# Patient Record
Sex: Female | Born: 1980 | Race: White | Hispanic: No | Marital: Single | Smoking: Never smoker
Health system: Southern US, Community
[De-identification: ages and names within clinical notes are randomized; demographics above are authoritative.]

## PROBLEM LIST (undated history)

## (undated) DIAGNOSIS — R569 Unspecified convulsions: Secondary | ICD-10-CM

## (undated) DIAGNOSIS — E162 Hypoglycemia, unspecified: Secondary | ICD-10-CM

---

## 2004-07-25 ENCOUNTER — Other Ambulatory Visit: Admission: RE | Admit: 2004-07-25 | Discharge: 2004-07-25 | Payer: Self-pay | Admitting: Family Medicine

## 2005-01-23 ENCOUNTER — Other Ambulatory Visit: Admission: RE | Admit: 2005-01-23 | Discharge: 2005-01-23 | Payer: Self-pay | Admitting: Family Medicine

## 2005-05-18 ENCOUNTER — Encounter: Admission: RE | Admit: 2005-05-18 | Discharge: 2005-05-18 | Payer: Self-pay | Admitting: Family Medicine

## 2005-08-04 ENCOUNTER — Other Ambulatory Visit: Admission: RE | Admit: 2005-08-04 | Discharge: 2005-08-04 | Payer: Self-pay | Admitting: Family Medicine

## 2006-08-29 ENCOUNTER — Emergency Department (HOSPITAL_COMMUNITY)
Admission: EM | Admit: 2006-08-29 | Discharge: 2006-08-29 | Payer: BLUE CROSS/BLUE SHIELD | Admitting: Emergency Medicine

## 2007-01-31 ENCOUNTER — Other Ambulatory Visit: Admission: RE | Admit: 2007-01-31 | Discharge: 2007-01-31 | Payer: Self-pay | Admitting: Family Medicine

## 2018-08-30 DIAGNOSIS — Z681 Body mass index (BMI) 19 or less, adult: Secondary | ICD-10-CM | POA: Diagnosis not present

## 2018-08-30 DIAGNOSIS — R5383 Other fatigue: Secondary | ICD-10-CM | POA: Diagnosis not present

## 2018-08-30 DIAGNOSIS — M25569 Pain in unspecified knee: Secondary | ICD-10-CM | POA: Diagnosis not present

## 2018-08-30 DIAGNOSIS — M25559 Pain in unspecified hip: Secondary | ICD-10-CM | POA: Diagnosis not present

## 2020-03-31 ENCOUNTER — Other Ambulatory Visit: Payer: Self-pay

## 2020-03-31 ENCOUNTER — Emergency Department (HOSPITAL_COMMUNITY): Payer: Self-pay

## 2020-03-31 ENCOUNTER — Emergency Department (HOSPITAL_COMMUNITY)
Admission: EM | Admit: 2020-03-31 | Discharge: 2020-03-31 | Disposition: A | Discharge status: Home / Self Care | Payer: Self-pay | Attending: Emergency Medicine | Admitting: Emergency Medicine

## 2020-03-31 DIAGNOSIS — M25562 Pain in left knee: Secondary | ICD-10-CM | POA: Insufficient documentation

## 2020-03-31 DIAGNOSIS — Z23 Encounter for immunization: Secondary | ICD-10-CM | POA: Insufficient documentation

## 2020-03-31 DIAGNOSIS — W19XXXA Unspecified fall, initial encounter: Secondary | ICD-10-CM

## 2020-03-31 DIAGNOSIS — S0121XA Laceration without foreign body of nose, initial encounter: Secondary | ICD-10-CM | POA: Insufficient documentation

## 2020-03-31 DIAGNOSIS — S022XXB Fracture of nasal bones, initial encounter for open fracture: Secondary | ICD-10-CM | POA: Insufficient documentation

## 2020-03-31 DIAGNOSIS — W010XXA Fall on same level from slipping, tripping and stumbling without subsequent striking against object, initial encounter: Secondary | ICD-10-CM | POA: Insufficient documentation

## 2020-03-31 DIAGNOSIS — Y999 Unspecified external cause status: Secondary | ICD-10-CM | POA: Insufficient documentation

## 2020-03-31 DIAGNOSIS — Y9301 Activity, walking, marching and hiking: Secondary | ICD-10-CM | POA: Insufficient documentation

## 2020-03-31 DIAGNOSIS — Y9259 Other trade areas as the place of occurrence of the external cause: Secondary | ICD-10-CM | POA: Insufficient documentation

## 2020-03-31 MED ORDER — CEPHALEXIN 500 MG PO CAPS: 500.0000 mg | ORAL_CAPSULE | Freq: Two times a day (BID) | ORAL | 0 refills | Status: AC

## 2020-03-31 MED ORDER — OXYMETAZOLINE HCL 0.05 % NA SOLN
1.0000 | Freq: Once | NASAL | Status: AC
  Administered 2020-03-31: 1 via NASAL
  Filled 2020-03-31: qty 30

## 2020-03-31 MED ORDER — TETANUS-DIPHTH-ACELL PERTUSSIS 5-2.5-18.5 LF-MCG/0.5 IM SUSP
0.5000 mL | Freq: Once | INTRAMUSCULAR | Status: AC
  Administered 2020-03-31: 0.5 mL via INTRAMUSCULAR
  Filled 2020-03-31: qty 0.5

## 2020-03-31 MED ORDER — LIDOCAINE HCL (PF) 1 % IJ SOLN
5.0000 mL | Freq: Once | INTRAMUSCULAR | Status: AC
  Administered 2020-03-31: 5 mL
  Filled 2020-03-31: qty 30

## 2020-03-31 MED ORDER — ACETAMINOPHEN 325 MG PO TABS
650.0000 mg | ORAL_TABLET | Freq: Once | ORAL | Status: AC
  Administered 2020-03-31: 650 mg via ORAL
  Filled 2020-03-31: qty 2

## 2020-03-31 MED ORDER — SODIUM CHLORIDE 0.9 % IV BOLUS: 1000.0000 mL | Freq: Once | INTRAVENOUS | Status: DC

## 2020-03-31 NOTE — ED Notes (Signed)
Patient ambulated to restroom and back to room unassisted

## 2020-03-31 NOTE — ED Notes (Signed)
Patient refused venipuncture

## 2020-03-31 NOTE — Discharge Instructions (Addendum)
Take the antibiotics as prescribed.  Hold pressure to nose if this starts bleeding  Do not blow your nose  Follow-up with the surgeon within 5 days, will need sutures removed  Return for new or worsening symptoms
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
## 2020-06-04 DIAGNOSIS — K8020 Calculus of gallbladder without cholecystitis without obstruction: Secondary | ICD-10-CM | POA: Diagnosis not present

## 2020-06-04 DIAGNOSIS — K7460 Unspecified cirrhosis of liver: Secondary | ICD-10-CM | POA: Diagnosis not present

## 2020-06-04 DIAGNOSIS — Z87898 Personal history of other specified conditions: Secondary | ICD-10-CM | POA: Diagnosis not present

## 2020-06-04 DIAGNOSIS — F419 Anxiety disorder, unspecified: Secondary | ICD-10-CM | POA: Diagnosis not present

## 2020-07-08 DIAGNOSIS — Z0000 Encounter for general adult medical examination without abnormal findings: Secondary | ICD-10-CM | POA: Diagnosis not present

## 2020-07-08 DIAGNOSIS — Z13220 Encounter for screening for lipoid disorders: Secondary | ICD-10-CM | POA: Diagnosis not present

## 2020-07-08 DIAGNOSIS — Z124 Encounter for screening for malignant neoplasm of cervix: Secondary | ICD-10-CM | POA: Diagnosis not present

## 2020-07-24 ENCOUNTER — Encounter (HOSPITAL_COMMUNITY): Payer: Self-pay

## 2020-07-24 ENCOUNTER — Other Ambulatory Visit: Payer: Self-pay

## 2020-07-24 ENCOUNTER — Emergency Department (HOSPITAL_COMMUNITY)
Admission: EM | Admit: 2020-07-24 | Discharge: 2020-07-24 | Disposition: A | Discharge status: Home / Self Care | Payer: BC Managed Care – PPO | Attending: Emergency Medicine | Admitting: Emergency Medicine

## 2020-07-24 ENCOUNTER — Emergency Department (HOSPITAL_COMMUNITY): Payer: BC Managed Care – PPO

## 2020-07-24 DIAGNOSIS — S0993XA Unspecified injury of face, initial encounter: Secondary | ICD-10-CM | POA: Diagnosis not present

## 2020-07-24 DIAGNOSIS — R7401 Elevation of levels of liver transaminase levels: Secondary | ICD-10-CM | POA: Diagnosis not present

## 2020-07-24 DIAGNOSIS — F10129 Alcohol abuse with intoxication, unspecified: Secondary | ICD-10-CM | POA: Diagnosis not present

## 2020-07-24 DIAGNOSIS — F10920 Alcohol use, unspecified with intoxication, uncomplicated: Secondary | ICD-10-CM

## 2020-07-24 DIAGNOSIS — S0181XA Laceration without foreign body of other part of head, initial encounter: Secondary | ICD-10-CM

## 2020-07-24 DIAGNOSIS — X58XXXA Exposure to other specified factors, initial encounter: Secondary | ICD-10-CM | POA: Insufficient documentation

## 2020-07-24 DIAGNOSIS — S01511A Laceration without foreign body of lip, initial encounter: Secondary | ICD-10-CM | POA: Insufficient documentation

## 2020-07-24 DIAGNOSIS — R531 Weakness: Secondary | ICD-10-CM | POA: Diagnosis not present

## 2020-07-24 DIAGNOSIS — R410 Disorientation, unspecified: Secondary | ICD-10-CM | POA: Diagnosis not present

## 2020-07-24 DIAGNOSIS — R4182 Altered mental status, unspecified: Secondary | ICD-10-CM | POA: Diagnosis not present

## 2020-07-24 DIAGNOSIS — W19XXXA Unspecified fall, initial encounter: Secondary | ICD-10-CM | POA: Diagnosis not present

## 2020-07-24 DIAGNOSIS — F10929 Alcohol use, unspecified with intoxication, unspecified: Secondary | ICD-10-CM | POA: Diagnosis not present

## 2020-07-24 HISTORY — DX: Unspecified convulsions: R56.9

## 2020-07-24 HISTORY — DX: Hypoglycemia, unspecified: E16.2

## 2020-07-24 LAB — COMPREHENSIVE METABOLIC PANEL
ALT: 56 U/L — ABNORMAL HIGH (ref 0–44)
AST: 262 U/L — ABNORMAL HIGH (ref 15–41)
Albumin: 3.9 g/dL (ref 3.5–5.0)
Alkaline Phosphatase: 225 U/L — ABNORMAL HIGH (ref 38–126)
Anion gap: 15 (ref 5–15)
BUN: <5 mg/dL — ABNORMAL LOW (ref 6–20)
CO2: 25 mmol/L (ref 22–32)
Calcium: 8.4 mg/dL — ABNORMAL LOW (ref 8.9–10.3)
Chloride: 102 mmol/L (ref 98–111)
Creatinine, Ser: 0.47 mg/dL (ref 0.44–1.00)
GFR, Estimated: >60 mL/min (ref >60)
Glucose, Bld: 125 mg/dL — ABNORMAL HIGH (ref 70–99)
Potassium: 3.5 mmol/L (ref 3.5–5.1)
Sodium: 142 mmol/L (ref 135–145)
Total Bilirubin: 6.4 mg/dL — ABNORMAL HIGH (ref 0.3–1.2)
Total Protein: 8.6 g/dL — ABNORMAL HIGH (ref 6.5–8.1)

## 2020-07-24 LAB — CBC WITH DIFFERENTIAL/PLATELET
Abs Immature Granulocytes: 0.01 10*3/uL (ref 0.00–0.07)
Basophils Absolute: 0.1 10*3/uL (ref 0.0–0.1)
Basophils Relative: 2 %
Eosinophils Absolute: 0.1 10*3/uL (ref 0.0–0.5)
Eosinophils Relative: 1 %
HCT: 41.3 % (ref 36.0–46.0)
Hemoglobin: 14.1 g/dL (ref 12.0–15.0)
Immature Granulocytes: 0 %
Lymphocytes Relative: 23 %
Lymphs Abs: 1.2 10*3/uL (ref 0.7–4.0)
MCH: 33.5 pg (ref 26.0–34.0)
MCHC: 34.1 g/dL (ref 30.0–36.0)
MCV: 98.1 fL (ref 80.0–100.0)
Monocytes Absolute: 0.5 10*3/uL (ref 0.1–1.0)
Monocytes Relative: 11 %
Neutro Abs: 3.2 10*3/uL (ref 1.7–7.7)
Neutrophils Relative %: 63 %
Platelets: 75 10*3/uL — ABNORMAL LOW (ref 150–400)
RBC: 4.21 MIL/uL (ref 3.87–5.11)
RDW: 15.1 % (ref 11.5–15.5)
WBC: 5 10*3/uL (ref 4.0–10.5)
nRBC: 0 % (ref 0.0–0.2)

## 2020-07-24 LAB — HEPATITIS PANEL, ACUTE
HCV Ab: NONREACTIVE
Hep A IgM: NONREACTIVE
Hep B C IgM: NONREACTIVE
Hepatitis B Surface Ag: NONREACTIVE

## 2020-07-24 LAB — RAPID URINE DRUG SCREEN, HOSP PERFORMED
Amphetamines: NOT DETECTED
Barbiturates: NOT DETECTED
Benzodiazepines: POSITIVE — AB
Cocaine: NOT DETECTED
Opiates: NOT DETECTED
Tetrahydrocannabinol: NOT DETECTED

## 2020-07-24 LAB — ETHANOL: Alcohol, Ethyl (B): 468 mg/dL (ref <10)

## 2020-07-24 LAB — POC URINE PREG, ED: Preg Test, Ur: NEGATIVE

## 2020-07-24 LAB — CBG MONITORING, ED: Glucose-Capillary: 115 mg/dL — ABNORMAL HIGH (ref 70–99)

## 2020-07-24 NOTE — Discharge Instructions (Signed)
You have been evaluated for your symptoms.  Your alcohol level is very high today.  Please avoid excessive drinking as it will negatively affect your health. You suffered a cut to your face.  Dermabond was placed to the wound.  Allow for it to dry and avoid prolong water exposure for the next several days. It will eventually fall off in the next 3-5 days.  Your liver function is abnormal, this is likely due to alcohol abuse.  Follow up with your doctor for further care.  Use resource below to seek help with alcohol dependency.  Return if you have any concerns.

## 2020-07-24 NOTE — ED Provider Notes (Signed)
[facility] DEPT Provider Note   CSN: [id number] Arrival date & time: 07/24/20  1107     History Chief Complaint  Patient presents with  . Altered Mental Status    Jill Farmer is a 39 y.o. female.  The history is provided by the patient, the EMS personnel and medical records. No language interpreter was used.  Altered Mental Status    39 year old female significant history of isolated seizure in 2017 brought here via EMS for evaluation of altered mental status.  Per EMS, patient's friend was concerned as patient was laying on the couch and disoriented after drinking heavy amount of alcohol last night.  Friend also mentioned that patient fell.  Patient however denies any recent fall or syncope.  She admits that she did drink heavy amount of alcohol after finding out that her ex-boyfriend recently passed away.  She appears feeling drowsy but denies any severe headache or facial pain.  She does admits having chronic pain but denies increase opiate use.  She did mention having a fall several months ago and injuring her face with leading up to a deviated septum which is not new.  She denies any facial pain or nose pain.  She denies any active chest pain shortness of breath abdominal pain nausea or vomiting.  She has been fully vaccinated with COVID-19 vaccine.  She denies any drug use.  Past Medical History:  Diagnosis Date  . Hypoglycemia   . Seizures ([place])    single episode 2017    There are no problems to display for this patient.   History reviewed. No pertinent surgical history.   OB History   No obstetric history on file.     History reviewed. No pertinent family history.  Social History   Tobacco Use  . Smoking status: Never Smoker  . Smokeless tobacco: Never Used  Substance Use Topics  . Alcohol use: Not on file  . Drug use: Not on file    Home Medications Prior to Admission medications   Not on File    Allergies    Nsaids  and Sulfa antibiotics  Review of Systems   Review of Systems  All other systems reviewed and are negative.   Physical Exam Updated Vital Signs BP 115/80 (BP Location: Right Arm)   Pulse 82   Temp 98 F (36.7 C) (Oral)   Resp 20   Ht _0  (1.702 m)   Wt 65 kg   SpO2 96%   BMI 22.44 kg/m   Physical Exam Vitals and nursing note reviewed.  Constitutional:      General: She is not in acute distress.    Appearance: She is well-developed.     Comments: Alcohol odor noted.  HENT:     Head:     Comments:  No raccoon's eyes or battle signs.  Dried blood noted Around the nares as well as around her lips.  There is a small superficial laceration noted right below the nose.  No dental tenderness.  No midface tenderness and no malocclusion.  No scalp tenderness.    Nose:     Comments: Left deviated septum with dried blood around the nares no septal hematoma appreciated.  Eyes:     Extraocular Movements: Extraocular movements intact.     Conjunctiva/sclera: Conjunctivae normal.     Pupils: Pupils are equal, round, and reactive to light.     Comments: Pupils dilated bilaterally reactive with extra movement intact.  Cardiovascular:  Rate and Rhythm: Normal rate and regular rhythm.     Pulses: Normal pulses.     Heart sounds: Normal heart sounds.  Pulmonary:     Effort: Pulmonary effort is normal.     Breath sounds: Normal breath sounds. No wheezing, rhonchi or rales.  Abdominal:     Palpations: Abdomen is soft.     Tenderness: There is no abdominal tenderness.  Musculoskeletal:     Cervical back: Neck supple.  Skin:    Findings: No rash.  Neurological:     Mental Status: She is alert and oriented to person, place, and time.     GCS: GCS eye subscore is 4. GCS verbal subscore is 5. GCS motor subscore is 6.     Cranial Nerves: Cranial nerves are intact.     Sensory: Sensation is intact.     Motor: Tremor present. No weakness.     Comments: Speech delay however patient is  alert oriented to situation, place, time and current event.  Psychiatric:        Mood and Affect: Affect is tearful.        Behavior: Behavior is cooperative.        Thought Content: Thought content does not include homicidal or suicidal ideation.     ED Results / Procedures / Treatments   Labs (all labs ordered are listed, but only abnormal results are displayed) Labs Reviewed  COMPREHENSIVE METABOLIC PANEL - Abnormal; Notable for the following components:      Result Value   Glucose, Bld 125 (*)    BUN <5 (*)    Calcium 8.4 (*)    Total Protein 8.6 (*)    AST 262 (*)    ALT 56 (*)    Alkaline Phosphatase 225 (*)    Total Bilirubin 6.4 (*)    All other components within normal limits  CBC WITH DIFFERENTIAL/PLATELET - Abnormal; Notable for the following components:   Platelets 75 (*)    All other components within normal limits  ETHANOL - Abnormal; Notable for the following components:   Alcohol, Ethyl (B) 468 (*)    All other components within normal limits  CBG MONITORING, ED - Abnormal; Notable for the following components:   Glucose-Capillary 115 (*)    All other components within normal limits  RAPID URINE DRUG SCREEN, HOSP PERFORMED  HEPATITIS PANEL, ACUTE  POC URINE PREG, ED    EKG EKG Interpretation  Date/Time:  Wednesday July 24 2020 11:39:51 EDT Ventricular Rate:  98 PR Interval:    QRS Duration: 84 QT Interval:  356 QTC Calculation: 455 R Axis:   3 Text Interpretation: Sinus rhythm Low voltage, extremity and precordial leads Probable anteroseptal infarct, old No old tracing to compare Confirmed by Sherwood Gambler [phone]) on 07/24/2020 11:56:21 AM   Radiology CT Maxillofacial Wo Contrast  Result Date: 07/24/2020 CLINICAL DATA:  Facial trauma last night. EXAM: CT MAXILLOFACIAL WITHOUT CONTRAST TECHNIQUE: Multidetector CT imaging of the maxillofacial structures was performed. Multiplanar CT image reconstructions were also generated. COMPARISON:   03/31/2020 FINDINGS: Osseous: No acute facial fracture. Nasal fractures which were acute in July of this year have healed. No evidence of additional bone injury. Nasal septum bows prominently towards the left. Orbits: Normal Sinuses: Clear.  No inflammatory change or layering blood. Soft tissues: Otherwise negative Limited intracranial: No acute finding. IMPRESSION: No acute traumatic finding. Nasal fractures which were acute in July of this year have healed. Electronically Signed   By: Jan Fireman.D.  On: 07/24/2020 12:23    Procedures .Marland KitchenLaceration Repair  Date/Time: 07/24/2020 2:41 PM Performed by: Domenic Moras, PA-C Authorized by: Domenic Moras, PA-C   Consent:    Consent obtained:  Verbal   Consent given by:  Patient   Risks discussed:  Infection, need for additional repair, pain, poor cosmetic result and poor wound healing   Alternatives discussed:  No treatment and delayed treatment Universal protocol:    Procedure explained and questions answered to patient or proxy's satisfaction: yes     Relevant documents present and verified: yes     Test results available and properly labeled: yes     Imaging studies available: yes     Required blood products, implants, devices, and special equipment available: yes     Site/side marked: yes     Immediately prior to procedure, a time out was called: yes     Patient identity confirmed:  Verbally with patient Anesthesia (see MAR for exact dosages):    Anesthesia method:  None Laceration details:    Location:  Lip   Lip location:  Upper exterior lip   Length (cm):  0.5   Depth (mm):  1 Repair type:    Repair type:  Simple Pre-procedure details:    Preparation:  Imaging obtained to evaluate for foreign bodies Exploration:    Wound extent: no foreign bodies/material noted and no underlying fracture noted     Contaminated: no   Skin repair:    Repair method:  Tissue adhesive Approximation:    Approximation:  Close Post-procedure  details:    Dressing:  Open (no dressing)   (including critical care time)  Medications Ordered in ED Medications - No data to display  ED Course  I have reviewed the triage vital signs and the nursing notes.  Pertinent labs & imaging results that were available during my care of the patient were reviewed by me and considered in my medical decision making (see chart for details).    MDM Rules/Calculators/A&P                          BP 111/70   Pulse (!) 103   Temp 98 F (36.7 C) (Oral)   Resp 13   Ht _0  (1.702 m)   Wt 65 kg   SpO2 97%   BMI 22.44 kg/m   Final Clinical Impression(s) / ED Diagnoses Final diagnoses:  None    Rx / DC Orders ED Discharge Orders    None     11:51 AM Patient sent here for evaluation of altered from altered mental status likely secondary to alcohol intoxication as she did admit to consuming moderate amount of alcohol last night.  Alcohol consumption secondary to recent loss of loved one.  No SI or HI.  She does have dried blood noted in her nares and a small laceration just below her nose.  She denies falling but in the setting of obvious injury, will obtain head and maxillofacial CT scan for further assessment.  Work-up initiated.  1:14 PM Labs remarkable for an alcohol of 468, likely contribute to patient's altered mental status secondary to alcohol intoxication.  Evidence of transaminitis with AST 262, ALT 56, alk phos 225 and total bili of 6.4.  This is likely secondary to alcohol abuse as well.  No abdominal pain to suggest acute hepatitis.  Hepatitis panel ordered.  We will continue with IV hydration.  Maxillofacial CT without acute changes.  Previous nasal  fracture has healed.  2:41 PM Dermabond applied to superficial laceration about the lip.  Hepatitis panel is currently pending however patient reports she does have history of transaminitis which is not new.  She denies recent Tylenol use.  She requested something to eat and drink.   Will provide food and drink.  At this time patient is not clinically sober and will need to be monitored further.  3:26 PM Hepatitis panel pending.  Pt sign out to oncoming provider who will reassess and d/c pt once she is clinically sober.   Domenic Moras, PA-C 07/24/20 1527    Sherwood Gambler, MD 07/24/20 [phone]

## 2020-07-24 NOTE — ED Notes (Signed)
Patient states her mother arrived to pick patient up, ambulated out of the department with no difficulty

## 2020-07-24 NOTE — ED Notes (Signed)
Patient ambulated to the restroom with no difficulty, got dressed and states she is going to call for transportation home. EDP made aware.

## 2020-07-24 NOTE — ED Notes (Signed)
dermabond placed in room for MD

## 2020-07-24 NOTE — ED Triage Notes (Signed)
Friend called ems due to patient laying on couch on her side disoriented. Patient a&O to self and year, pt slow to respond. PERRLA, pupils 17mm. Pt admits to ETOH use last night, denies drugs. Friend states patient fell but patient denies fall. Dried blood to nostrils, pt states deviated septum recently.

## 2020-07-24 NOTE — ED Notes (Signed)
Patient transported to CT 

## 2020-07-24 NOTE — ED Notes (Signed)
Date and time results received: 07/24/20 1311 (use smartphrase ".now" to insert current time)  Test: ETOH Critical Value: 468 Name of Provider Notified: Regenia Skeeter  Orders Received? Or Actions Taken?:

## 2020-07-24 NOTE — ED Provider Notes (Signed)
Patient taken in signout from [place] at shift change at 3 PM.  Patient came in with alcohol intoxication.  She has been sleeping the whole time however got up walked without any assistance or ataxia to use the restroom, came back to her bed and called her ride because she states she is ready to go.  She is now clinically sober if not an emergency and appears appropriate for discharge.   Margarita Mail, PA-C 07/24/20 2024    Isla Pence, MD 07/24/20 [phone]

## 2020-07-24 NOTE — ED Notes (Signed)
Date and time results received: 07/24/20 1:11 PM  (use smartphrase ".now" to insert current time)  Test: ETOH Critical Value: 468 Name of Provider Notified: Notified Primary RN, Caryl Pina  Orders Received? Or Actions Taken?: Actions Taken: Notified primary RN

## 2020-08-04 ENCOUNTER — Emergency Department (HOSPITAL_COMMUNITY)
Admission: EM | Admit: 2020-08-04 | Discharge: 2020-08-04 | Disposition: A | Discharge status: Home / Self Care | Payer: BC Managed Care – PPO | Attending: Emergency Medicine | Admitting: Emergency Medicine

## 2020-08-04 ENCOUNTER — Other Ambulatory Visit: Payer: Self-pay

## 2020-08-04 DIAGNOSIS — F10929 Alcohol use, unspecified with intoxication, unspecified: Secondary | ICD-10-CM | POA: Diagnosis not present

## 2020-08-04 DIAGNOSIS — F10129 Alcohol abuse with intoxication, unspecified: Secondary | ICD-10-CM | POA: Insufficient documentation

## 2020-08-04 DIAGNOSIS — F1010 Alcohol abuse, uncomplicated: Secondary | ICD-10-CM

## 2020-08-04 DIAGNOSIS — R0902 Hypoxemia: Secondary | ICD-10-CM | POA: Diagnosis not present
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
## 2020-08-04 NOTE — ED Triage Notes (Signed)
PER [facility] [place] PD gave pt option to go to jail for 24h hold or go to hospital. Pt was at [facility] and was kicked out. PT has severe ETOH last drink this AM of 2 vodka tonics HX gall stones, allergy to insaids  and has chronic pain.

## 2020-08-06 DIAGNOSIS — E46 Unspecified protein-calorie malnutrition: Secondary | ICD-10-CM | POA: Diagnosis not present

## 2020-08-06 DIAGNOSIS — F1020 Alcohol dependence, uncomplicated: Secondary | ICD-10-CM | POA: Diagnosis not present

## 2020-08-06 DIAGNOSIS — K7460 Unspecified cirrhosis of liver: Secondary | ICD-10-CM | POA: Diagnosis not present

## 2020-08-06 DIAGNOSIS — E876 Hypokalemia: Secondary | ICD-10-CM | POA: Diagnosis not present

## 2020-08-21 ENCOUNTER — Encounter (HOSPITAL_COMMUNITY): Payer: Self-pay | Admitting: Emergency Medicine

## 2020-08-21 ENCOUNTER — Emergency Department (HOSPITAL_COMMUNITY): Payer: BC Managed Care – PPO

## 2020-08-21 ENCOUNTER — Inpatient Hospital Stay (HOSPITAL_COMMUNITY)
Admission: EM | Admit: 2020-08-21 | Discharge: 2020-08-23 | DRG: 433 | Disposition: A | Discharge status: Home / Self Care | Payer: BC Managed Care – PPO | Attending: Family Medicine | Admitting: Family Medicine

## 2020-08-21 DIAGNOSIS — G8929 Other chronic pain: Secondary | ICD-10-CM | POA: Diagnosis present

## 2020-08-21 DIAGNOSIS — K8020 Calculus of gallbladder without cholecystitis without obstruction: Secondary | ICD-10-CM | POA: Diagnosis present

## 2020-08-21 DIAGNOSIS — K7031 Alcoholic cirrhosis of liver with ascites: Principal | ICD-10-CM

## 2020-08-21 DIAGNOSIS — D689 Coagulation defect, unspecified: Secondary | ICD-10-CM | POA: Diagnosis not present

## 2020-08-21 DIAGNOSIS — K7011 Alcoholic hepatitis with ascites: Secondary | ICD-10-CM | POA: Diagnosis not present

## 2020-08-21 DIAGNOSIS — R109 Unspecified abdominal pain: Secondary | ICD-10-CM | POA: Diagnosis not present

## 2020-08-21 DIAGNOSIS — M7050 Other bursitis of knee, unspecified knee: Secondary | ICD-10-CM | POA: Diagnosis present

## 2020-08-21 DIAGNOSIS — Z886 Allergy status to analgesic agent status: Secondary | ICD-10-CM

## 2020-08-21 DIAGNOSIS — E162 Hypoglycemia, unspecified: Secondary | ICD-10-CM | POA: Diagnosis present

## 2020-08-21 DIAGNOSIS — F1020 Alcohol dependence, uncomplicated: Secondary | ICD-10-CM | POA: Diagnosis not present

## 2020-08-21 DIAGNOSIS — K7040 Alcoholic hepatic failure without coma: Secondary | ICD-10-CM | POA: Diagnosis present

## 2020-08-21 DIAGNOSIS — Z882 Allergy status to sulfonamides status: Secondary | ICD-10-CM | POA: Diagnosis not present

## 2020-08-21 DIAGNOSIS — M549 Dorsalgia, unspecified: Secondary | ICD-10-CM | POA: Diagnosis present

## 2020-08-21 DIAGNOSIS — R000 Tachycardia, unspecified: Secondary | ICD-10-CM | POA: Diagnosis not present

## 2020-08-21 DIAGNOSIS — Z20822 Contact with and (suspected) exposure to covid-19: Secondary | ICD-10-CM | POA: Diagnosis not present

## 2020-08-21 DIAGNOSIS — R17 Unspecified jaundice: Secondary | ICD-10-CM | POA: Diagnosis not present

## 2020-08-21 DIAGNOSIS — R1084 Generalized abdominal pain: Secondary | ICD-10-CM | POA: Insufficient documentation

## 2020-08-21 DIAGNOSIS — R0602 Shortness of breath: Secondary | ICD-10-CM | POA: Diagnosis not present

## 2020-08-21 DIAGNOSIS — R569 Unspecified convulsions: Secondary | ICD-10-CM | POA: Diagnosis not present

## 2020-08-21 DIAGNOSIS — K7460 Unspecified cirrhosis of liver: Secondary | ICD-10-CM | POA: Diagnosis not present

## 2020-08-21 DIAGNOSIS — K7010 Alcoholic hepatitis without ascites: Secondary | ICD-10-CM | POA: Diagnosis present

## 2020-08-21 DIAGNOSIS — R188 Other ascites: Secondary | ICD-10-CM | POA: Diagnosis not present

## 2020-08-21 LAB — LIPASE, BLOOD: Lipase: 47 U/L (ref 11–51)

## 2020-08-21 LAB — URINALYSIS, ROUTINE W REFLEX MICROSCOPIC
Glucose, UA: NEGATIVE mg/dL
Hgb urine dipstick: NEGATIVE
Ketones, ur: NEGATIVE mg/dL
Leukocytes,Ua: NEGATIVE
Nitrite: POSITIVE — AB
Protein, ur: NEGATIVE mg/dL
Specific Gravity, Urine: >1.046 — ABNORMAL HIGH (ref 1.005–1.030)
pH: 6 (ref 5.0–8.0)

## 2020-08-21 LAB — COMPREHENSIVE METABOLIC PANEL
ALT: 38 U/L (ref 0–44)
AST: 163 U/L — ABNORMAL HIGH (ref 15–41)
Albumin: 2.4 g/dL — ABNORMAL LOW (ref 3.5–5.0)
Alkaline Phosphatase: 140 U/L — ABNORMAL HIGH (ref 38–126)
Anion gap: 13 (ref 5–15)
BUN: <5 mg/dL — ABNORMAL LOW (ref 6–20)
CO2: 25 mmol/L (ref 22–32)
Calcium: 8.9 mg/dL (ref 8.9–10.3)
Chloride: 96 mmol/L — ABNORMAL LOW (ref 98–111)
Creatinine, Ser: 0.64 mg/dL (ref 0.44–1.00)
GFR, Estimated: >60 mL/min (ref >60)
Glucose, Bld: 113 mg/dL — ABNORMAL HIGH (ref 70–99)
Potassium: 3.2 mmol/L — ABNORMAL LOW (ref 3.5–5.1)
Sodium: 134 mmol/L — ABNORMAL LOW (ref 135–145)
Total Bilirubin: 19.3 mg/dL (ref 0.3–1.2)
Total Protein: 7.8 g/dL (ref 6.5–8.1)

## 2020-08-21 LAB — BODY FLUID CELL COUNT WITH DIFFERENTIAL
Eos, Fluid: 0 %
Lymphs, Fluid: 65 %
Monocyte-Macrophage-Serous Fluid: 32 % — ABNORMAL LOW (ref 50–90)
Neutrophil Count, Fluid: 3 % (ref 0–25)
Total Nucleated Cell Count, Fluid: 109 cu mm (ref 0–1000)

## 2020-08-21 LAB — CBC WITH DIFFERENTIAL/PLATELET
Abs Immature Granulocytes: 0.08 10*3/uL — ABNORMAL HIGH (ref 0.00–0.07)
Basophils Absolute: 0.1 10*3/uL (ref 0.0–0.1)
Basophils Relative: 2 %
Eosinophils Absolute: 0.1 10*3/uL (ref 0.0–0.5)
Eosinophils Relative: 1 %
HCT: 40.3 % (ref 36.0–46.0)
Hemoglobin: 13.7 g/dL (ref 12.0–15.0)
Immature Granulocytes: 1 %
Lymphocytes Relative: 12 %
Lymphs Abs: 1 10*3/uL (ref 0.7–4.0)
MCH: 34.2 pg — ABNORMAL HIGH (ref 26.0–34.0)
MCHC: 34 g/dL (ref 30.0–36.0)
MCV: 100.5 fL — ABNORMAL HIGH (ref 80.0–100.0)
Monocytes Absolute: 1.1 10*3/uL — ABNORMAL HIGH (ref 0.1–1.0)
Monocytes Relative: 13 %
Neutro Abs: 6.3 10*3/uL (ref 1.7–7.7)
Neutrophils Relative %: 71 %
Platelets: 165 10*3/uL (ref 150–400)
RBC: 4.01 MIL/uL (ref 3.87–5.11)
RDW: 16.3 % — ABNORMAL HIGH (ref 11.5–15.5)
WBC: 8.8 10*3/uL (ref 4.0–10.5)
nRBC: 0 % (ref 0.0–0.2)

## 2020-08-21 LAB — LACTATE DEHYDROGENASE, PLEURAL OR PERITONEAL FLUID: LD, Fluid: 16 U/L (ref 3–23)

## 2020-08-21 LAB — RESP PANEL BY RT-PCR (FLU A&B, COVID) ARPGX2
Influenza A by PCR: NEGATIVE
Influenza B by PCR: NEGATIVE
SARS Coronavirus 2 by RT PCR: NEGATIVE

## 2020-08-21 LAB — LACTATE DEHYDROGENASE: LDH: 178 U/L (ref 98–192)

## 2020-08-21 LAB — APTT: aPTT: 40 seconds — ABNORMAL HIGH (ref 24–36)

## 2020-08-21 LAB — I-STAT BETA HCG BLOOD, ED (MC, WL, AP ONLY): I-stat hCG, quantitative: <5 m[IU]/mL (ref <5)

## 2020-08-21 LAB — ALBUMIN, PLEURAL OR PERITONEAL FLUID: Albumin, Fluid: <1 g/dL

## 2020-08-21 LAB — GLUCOSE, PLEURAL OR PERITONEAL FLUID: Glucose, Fluid: 108 mg/dL

## 2020-08-21 LAB — AMMONIA: Ammonia: 63 umol/L — ABNORMAL HIGH (ref 9–35)

## 2020-08-21 LAB — PROTIME-INR
INR: 1.7 — ABNORMAL HIGH (ref 0.8–1.2)
Prothrombin Time: 19.4 seconds — ABNORMAL HIGH (ref 11.4–15.2)

## 2020-08-21 LAB — PROTEIN, PLEURAL OR PERITONEAL FLUID: Total protein, fluid: <3 g/dL

## 2020-08-21 MED ORDER — FOLIC ACID 1 MG PO TABS
1.0000 mg | ORAL_TABLET | Freq: Every day | ORAL | Status: DC
  Administered 2020-08-22 – 2020-08-23 (×3): 1 mg via ORAL
  Filled 2020-08-21 (×3): qty 1

## 2020-08-21 MED ORDER — MORPHINE SULFATE (PF) 4 MG/ML IV SOLN
4.0000 mg | INTRAVENOUS | Status: AC | PRN
  Administered 2020-08-22 (×2): 4 mg via INTRAVENOUS
  Filled 2020-08-21 (×3): qty 1

## 2020-08-21 MED ORDER — MORPHINE SULFATE (PF) 4 MG/ML IV SOLN
4.0000 mg | Freq: Once | INTRAVENOUS | Status: AC
  Administered 2020-08-21: 4 mg via INTRAVENOUS
  Filled 2020-08-21: qty 1

## 2020-08-21 MED ORDER — LORAZEPAM 1 MG PO TABS: 1.0000 mg | ORAL_TABLET | ORAL | Status: DC | PRN

## 2020-08-21 MED ORDER — ALBUMIN HUMAN 25 % IV SOLN
12.5000 g | Freq: Four times a day (QID) | INTRAVENOUS | Status: DC
  Administered 2020-08-22 – 2020-08-23 (×6): 12.5 g via INTRAVENOUS
  Filled 2020-08-21 (×10): qty 50

## 2020-08-21 MED ORDER — ADULT MULTIVITAMIN W/MINERALS CH
1.0000 | ORAL_TABLET | Freq: Every day | ORAL | Status: DC
  Administered 2020-08-22 – 2020-08-23 (×3): 1 via ORAL
  Filled 2020-08-21 (×4): qty 1

## 2020-08-21 MED ORDER — ONDANSETRON HCL 4 MG/2ML IJ SOLN: 4.0000 mg | Freq: Four times a day (QID) | INTRAMUSCULAR | Status: DC | PRN

## 2020-08-21 MED ORDER — LIDOCAINE HCL (PF) 1 % IJ SOLN
20.0000 mL | Freq: Once | INTRAMUSCULAR | Status: AC
  Administered 2020-08-21: 5 mL
  Filled 2020-08-21: qty 20

## 2020-08-21 MED ORDER — SODIUM CHLORIDE 0.9 % IV SOLN
1.0000 g | Freq: Once | INTRAVENOUS | Status: AC
  Administered 2020-08-21: 1 g via INTRAVENOUS
  Filled 2020-08-21: qty 10

## 2020-08-21 MED ORDER — THIAMINE HCL 100 MG PO TABS
100.0000 mg | ORAL_TABLET | Freq: Every day | ORAL | Status: DC
  Administered 2020-08-22 (×2): 100 mg via ORAL
  Filled 2020-08-21 (×3): qty 1

## 2020-08-21 MED ORDER — MORPHINE SULFATE (PF) 4 MG/ML IV SOLN: 4.0000 mg | Freq: Once | INTRAVENOUS | Status: DC

## 2020-08-21 MED ORDER — FENTANYL CITRATE (PF) 100 MCG/2ML IJ SOLN
100.0000 ug | INTRAMUSCULAR | Status: AC | PRN
  Administered 2020-08-21 – 2020-08-22 (×2): 100 ug via INTRAVENOUS
  Filled 2020-08-21 (×3): qty 2

## 2020-08-21 MED ORDER — ONDANSETRON HCL 4 MG PO TABS: 4.0000 mg | ORAL_TABLET | Freq: Four times a day (QID) | ORAL | Status: DC | PRN

## 2020-08-21 MED ORDER — SODIUM CHLORIDE 0.9 % IV SOLN: 2.0000 g | INTRAVENOUS | Status: DC

## 2020-08-21 MED ORDER — FUROSEMIDE 20 MG PO TABS
20.0000 mg | ORAL_TABLET | Freq: Every day | ORAL | Status: DC
  Administered 2020-08-22 – 2020-08-23 (×2): 20 mg via ORAL
  Filled 2020-08-21 (×2): qty 1

## 2020-08-21 MED ORDER — SPIRONOLACTONE 25 MG PO TABS
100.0000 mg | ORAL_TABLET | Freq: Every day | ORAL | Status: DC
  Administered 2020-08-22 – 2020-08-23 (×2): 100 mg via ORAL
  Filled 2020-08-21 (×2): qty 4

## 2020-08-21 MED ORDER — PREDNISOLONE 5 MG PO TABS
30.0000 mg | ORAL_TABLET | Freq: Two times a day (BID) | ORAL | Status: DC
  Administered 2020-08-22 – 2020-08-23 (×4): 30 mg via ORAL
  Filled 2020-08-21 (×5): qty 6

## 2020-08-21 MED ORDER — POTASSIUM CHLORIDE CRYS ER 20 MEQ PO TBCR
40.0000 meq | EXTENDED_RELEASE_TABLET | Freq: Once | ORAL | Status: AC
  Administered 2020-08-22: 40 meq via ORAL
  Filled 2020-08-21: qty 2

## 2020-08-21 MED ORDER — PHYTONADIONE 5 MG PO TABS
5.0000 mg | ORAL_TABLET | Freq: Once | ORAL | Status: AC
  Administered 2020-08-22: 5 mg via ORAL
  Filled 2020-08-21 (×2): qty 1

## 2020-08-21 MED ORDER — MELATONIN 3 MG PO TABS
3.0000 mg | ORAL_TABLET | Freq: Every day | ORAL | Status: DC
  Administered 2020-08-22: 3 mg via ORAL
  Filled 2020-08-21 (×2): qty 1

## 2020-08-21 MED ORDER — THIAMINE HCL 100 MG/ML IJ SOLN
100.0000 mg | Freq: Every day | INTRAMUSCULAR | Status: DC
  Administered 2020-08-23: 100 mg via INTRAVENOUS
  Filled 2020-08-21 (×2): qty 2

## 2020-08-21 MED ORDER — LACTULOSE 10 GM/15ML PO SOLN
30.0000 g | Freq: Every day | ORAL | Status: DC
  Administered 2020-08-22 – 2020-08-23 (×2): 30 g via ORAL
  Filled 2020-08-21 (×2): qty 45

## 2020-08-21 MED ORDER — IOHEXOL 300 MG/ML  SOLN
100.0000 mL | Freq: Once | INTRAMUSCULAR | Status: AC | PRN
  Administered 2020-08-21: 100 mL via INTRAVENOUS

## 2020-08-21 MED ORDER — LORAZEPAM 2 MG/ML IJ SOLN
1.0000 mg | INTRAMUSCULAR | Status: DC | PRN
  Administered 2020-08-22 (×2): 2 mg via INTRAVENOUS
  Filled 2020-08-21 (×2): qty 1

## 2020-08-21 NOTE — ED Triage Notes (Signed)
Pt here with a swollen belly thinks she needs fluid drawn off , last done 4 yrs ago , last drink 2 weeks ago

## 2020-08-21 NOTE — ED Notes (Signed)
Pt eating [employer] when nurse entered room.

## 2020-08-21 NOTE — H&P (Signed)
History and Physical    [name]:[id number] DOB: [birth year] DOA: 08/21/2020  PCP: Patient, No Pcp Per (Confirm with patient/family/NH records and if not entered, this has to be entered at [facility] point of entry) Patient coming from: Home  I have personally briefly reviewed patient's old medical records in [place]  Chief Complaint: Belly hurts  HPI: Jill Farmer is a 39 y.o. female with medical history significant of alcoholic cirrhosis diagnosed in 2017, bilateral knee bursitis, presented with worsening of jaundice and abdominal pain.  Patient was diagnosed alcoholic cirrhosis in [place] in 2015, she has been following with her hepatologist until 2019 in [place].  She was once on the transplant list.  But patient reported that she was not prescribed with diuresis medications.  Since 2019, patient lost her follow-ups and in 2020, she moved to [place] area.  Patient claimed that she has been restrained  from alcohol use since 2019, but for the last 3 to 4 months she started to pick up the binge drinking again, drinks vodka 2-3 times a week and last drink was 2 weeks ago.  5 days ago she started to feel sharp like abdominal pain, generalized, associated with nausea, she tried to take Zofran occasionally with minimum help.  She denied any diarrhea and she described her stool as hard.  Denied any fever or chills.  Meantime, her abdomen becomes more distended. ED Course: Diagnostic paracentesis was done, results pending.  Lab work showed significant bilirubinemia but no leukocytosis.  Review of Systems: As per HPI otherwise 14 point review of systems negative.    Past Medical History:  Diagnosis Date  . Hypoglycemia   . Seizures ([place])    single episode 2017    History reviewed. No pertinent surgical history.   reports that she has never smoked. She has never used smokeless tobacco. No history on file for alcohol use and drug use.  Allergies  Allergen Reactions  . Nsaids  Anaphylaxis  . Sulfa Antibiotics Other (See Comments)    Unknown    No family history on file.   Prior to Admission medications   Medication Sig Start Date End Date Taking? Authorizing Provider  MILK THISTLE PO Take 1 tablet by mouth in the morning and at bedtime.   Yes [provider]  Multiple Vitamin (MULTIVITAMIN WITH MINERALS) TABS tablet Take 1 tablet by mouth daily.   Yes [provider]  ondansetron (ZOFRAN-ODT) 4 MG disintegrating tablet Take 4 mg by mouth every 8 (eight) hours as needed for nausea or vomiting.  03/07/19  Yes [provider]  potassium chloride (KLOR-CON) 10 MEQ tablet Take 10 mEq by mouth daily. 07/11/20  Yes [provider]    Physical Exam: Vitals:   08/21/20 1500 08/21/20 1530 08/21/20 1600 08/21/20 1802  BP: 118/76 115/77 117/77 119/79  Pulse: 95 93 94 (!) 101  Resp: 18 13 14 13   Temp:      TempSrc:      SpO2: 98% 95% 96% 97%  Weight:      Height:        Constitutional: NAD, calm, comfortable Vitals:   08/21/20 1500 08/21/20 1530 08/21/20 1600 08/21/20 1802  BP: 118/76 115/77 117/77 119/79  Pulse: 95 93 94 (!) 101  Resp: 18 13 14 13   Temp:      TempSrc:      SpO2: 98% 95% 96% 97%  Weight:      Height:       Eyes: PERRL,  lids and conjunctivae normal ENMT: Mucous membranes are moist. Posterior pharynx clear of any exudate or lesions.Normal dentition.  Neck: normal, supple, no masses, no thyromegaly Respiratory: clear to auscultation bilaterally, no wheezing, no crackles. Normal respiratory effort. No accessory muscle use.  Cardiovascular: Regular rate and rhythm, no murmurs / rubs / gallops. No extremity edema. 2+ pedal pulses. No carotid bruits.  Abdomen: Distended, mild tenderness on all quadrants, no rebound no guarding, positive ascites sign Musculoskeletal: no clubbing / cyanosis. No joint deformity upper and lower extremities. Good ROM, no contractures. Normal muscle tone.  Skin: no rashes, lesions,  ulcers. No induration Neurologic: CN 2-12 grossly intact. Sensation intact, DTR normal. Strength 5/5 in all 4.  Psychiatric: Normal judgment and insight. Alert and oriented x 3. Normal mood.     Labs on Admission: I have personally reviewed following labs and imaging studies  CBC: Recent Labs  Lab 08/21/20 1215  WBC 8.8  NEUTROABS 6.3  HGB 13.7  HCT 40.3  MCV 100.5*  PLT 696   Basic Metabolic Panel: Recent Labs  Lab 08/21/20 1215  NA 134*  K 3.2*  CL 96*  CO2 25  GLUCOSE 113*  BUN <5*  CREATININE 0.64  CALCIUM 8.9   GFR: Estimated Creatinine Clearance: 93.6 mL/min (by C-G formula based on SCr of 0.64 mg/dL). Liver Function Tests: Recent Labs  Lab 08/21/20 1215  AST 163*  ALT 38  ALKPHOS 140*  BILITOT 19.3*  PROT 7.8  ALBUMIN 2.4*   Recent Labs  Lab 08/21/20 1215  LIPASE 47   Recent Labs  Lab 08/21/20 1514  AMMONIA 63*   Coagulation Profile: Recent Labs  Lab 08/21/20 1514  INR 1.7*   Cardiac Enzymes: No results for input(s): CKTOTAL, CKMB, CKMBINDEX, TROPONINI in the last 168 hours. BNP (last 3 results) No results for input(s): PROBNP in the last 8760 hours. HbA1C: No results for input(s): HGBA1C in the last 72 hours. CBG: No results for input(s): GLUCAP in the last 168 hours. Lipid Profile: No results for input(s): CHOL, HDL, LDLCALC, TRIG, CHOLHDL, LDLDIRECT in the last 72 hours. Thyroid Function Tests: No results for input(s): TSH, T4TOTAL, FREET4, T3FREE, THYROIDAB in the last 72 hours. Anemia Panel: No results for input(s): VITAMINB12, FOLATE, FERRITIN, TIBC, IRON, RETICCTPCT in the last 72 hours. Urine analysis:    Component Value Date/Time   COLORURINE AMBER (A) 08/21/2020 1756   APPEARANCEUR CLEAR 08/21/2020 1756   LABSPEC >1.046 (H) 08/21/2020 1756   PHURINE 6.0 08/21/2020 1756   GLUCOSEU NEGATIVE 08/21/2020 1756   HGBUR NEGATIVE 08/21/2020 1756   BILIRUBINUR MODERATE (A) 08/21/2020 1756   KETONESUR NEGATIVE 08/21/2020 1756    PROTEINUR NEGATIVE 08/21/2020 1756   NITRITE POSITIVE (A) 08/21/2020 1756   LEUKOCYTESUR NEGATIVE 08/21/2020 1756    Radiological Exams on Admission: CT ABDOMEN PELVIS W CONTRAST  Result Date: 08/21/2020 CLINICAL DATA:  Abdominal pain, cirrhosis with generalized abdominal pain, concern for SBP EXAM: CT ABDOMEN AND PELVIS WITH CONTRAST TECHNIQUE: Multidetector CT imaging of the abdomen and pelvis was performed using the standard protocol following bolus administration of intravenous contrast. CONTRAST:  134mL OMNIPAQUE IOHEXOL 300 MG/ML  SOLN COMPARISON:  None. FINDINGS: Lower chest: Atelectatic changes are present in the lung bases. Normal heart size. No pericardial effusion. Hepatobiliary: Diffusely heterogeneous and nodular liver compatible with provided history of cirrhosis. Intermediate attenuation (70 HU) lesion is seen in the anterior segment 4 (3/15) measuring 1.8 by 1.8 cm in size, indeterminate on this single phase CT examination. Geographic regions of  more focal hyperattenuation seen in the periphery of the right lobe and left lobe liver likely related to altered perfusion dynamics. These may result in some artifactual conspicuity particularly in the anterior left lobe (3/11) versus developing regenerative nodules. No intrahepatic biliary dilatation. Gallbladder is fairly distended with multiple layering gallstones and few prominent gallbladder folds. No gallbladder wall thickening is seen however. No biliary ductal dilatation or intraductal gallstones are seen. Pancreas: No pancreatic ductal dilatation or surrounding inflammatory changes. Spleen: Splenomegaly.  No concerning focal splenic lesion. Adrenals/Urinary Tract: Normal adrenal glands. Kidneys are normally located with symmetric enhancement. No suspicious renal lesion, urolithiasis or hydronephrosis. The urinary bladder is unremarkable for the degree of distention Stomach/Bowel: Distal esophagus, stomach and duodenal sweep are  unremarkable. No small bowel wall thickening or dilatation. No evidence of obstruction. A normal appendix is visualized. No colonic dilatation or wall thickening. Vascular/Lymphatic: Extensive upper abdominal venous collateralization including reconstitution tortuosity of the umbilical vein, caput medusa a of the anterior abdominal wall, right slightly more pronounced than left, and additional collateralization involving the SMV and renal vein circulation. Mild narrowing of the intrahepatic IVC likely related to cirrhotic changes. Hepatic veins are poorly opacified. Abdominal aorta is normal caliber. No acute luminal abnormality. Reproductive: Anteverted uterus.  No concerning adnexal lesions. Other: Moderate to large volume ascites. No discernible rim enhancing loculation or collection is seen. No free air. No bowel containing hernias small fat containing umbilical hernia. Vascular collaterals as above. Musculoskeletal: Multilevel degenerative changes are present in the imaged portions of the spine. No acute osseous abnormality or suspicious osseous lesion. IMPRESSION: 1. Diffusely heterogeneous and nodular liver compatible with provided history of cirrhosis. Geographic regions of hyperattenuation seen in the periphery of the right lobe and left lobe liver likely related to altered perfusion dynamics. These may result in some artifactual conspicuity particularly in the anterior left lobe liver (3/11) versus developing regenerative nodules. An additional indeterminate 1.8 cm nodule is seen in the anterior segment 4. Consider further evaluation with nonemergent liver MRI. 2. Additional stigmata of cirrhosis including upper abdominal venous collateralization including reconstitution, tortuosity of the umbilical vein, caput medusa, and additional collateralization involving the SMV and right renal vein circulation. Mild narrowing of the intrahepatic IVC likely related to cirrhotic changes. 3. Splenomegaly. 4. Moderate  to large volume ascites. No discernible rim enhancing loculation or collection is seen though spontaneous bacterial peritonitis cannot be fully excluded on an imaging basis. Consider fluid sampling. 5. Cholelithiasis with gallbladder distention. No gallbladder wall thickening or biliary dilatation is seen. If there is persisting concern for acute cholecystitis however, consider ultrasound or HIDA. Electronically Signed   By: Lovena Le M.D.   On: 08/21/2020 17:01   DG Chest Portable 1 View  Result Date: 08/21/2020 CLINICAL DATA:  Shortness of breath. EXAM: PORTABLE CHEST 1 VIEW COMPARISON:  No pertinent prior exams available for comparison. FINDINGS: Heart size within normal limits. No appreciable airspace consolidation or pulmonary edema. No evidence of pleural effusion or pneumothorax. Elevation of the right hemidiaphragm. No evidence of acute bony abnormality. IMPRESSION: No evidence of acute cardiopulmonary abnormality. Elevation of the right hemidiaphragm, nonspecific but possibly reflecting underlying hepatomegaly. Electronically Signed   By: Kellie Simmering DO   On: 08/21/2020 15:27    EKG: Independently reviewed.  Sinus tachycardia  Assessment/Plan Active Problems:   Alcoholic hepatitis with ascites   Hepatitis, alcoholic, acute  (please populate well all problems here in Problem List. (For example, if patient is on BP meds at home and  you resume or decide to hold them, it is a problem that needs to be her. Same for CAD, COPD, HLD and so on)  Acute (probably on chronic) liver failure -She has impaired synthetic function as under elevated and albumin level decreased, and new onset of ascites, which patient claimed last paracentesis was more than 4 years ago. -Elevated ammonia level but no symptoms signs of encephalopathy - MELD=26, associated with 3 months mortality around 19.6%.  [facility] informed. -Given the acute onset history, will need to rule out malignant transformation and other  hepatitis.  Send AFP and hepatitis panel -Follow daily liver functions, consider liver transplant center transfer if no significant improvement.  Acute alcoholic hepatitis -Discriminant factor more than 32, systemic steroid indicated.  Start prednisolone 1 mg/kg  Decompensated cirrhosis -Therapeutic paracentesis tomorrow -Diagnostic paracentesis done today result pending, patient received ceftriaxone -Start Lasix 20 mg p.o. daily plus spironolactone 100 mg daily. -Start lactulose, titrate according to response -Albumin infusion x3 days  Abdominal pain rule out SBP -As above  Cirrhotic coagulopathy -Vitamin K x1 today and recheck INR tomorrow  Alcohol abuse -As of now, nurse no symptoms signs of acute withdrawal -CIWA protocol with as needed benzos.  DVT prophylaxis: SCD Code Status: Full code Family Communication: None at bedside Disposition Plan: Expect more than 2 midnight hospital stay, given the size of the ascites, may need more than 1 paracentesis Consults called: [facility] Admission status: Telemetry admission  Lequita Halt MD [facility] Pager [phone]  08/21/2020, 7:13 PM

## 2020-08-21 NOTE — ED Provider Notes (Addendum)
[facility] EMERGENCY DEPARTMENT Provider Note   CSN: [id number] Arrival date & time: 08/21/20  1113     History No chief complaint on file.   Jill Farmer is a 39 y.o. female.  HPI  Patient is 39 year old patient with alcoholic hepatitis/cirrhosis presented today with 5 days of severe worsening achy sharp and stabbing abdominal pain that is generalized.  She states that her abdomen has gradually become more swollen over the past week or 2.  She states that her last alcohol intake was approximately 1 week ago.  She states she has been binge drinking over the past 2 weeks but mother states that she has been sneaking alcohol for longer than that.  She states her last paracentesis was done 4 years ago in [place].  She has never had an episode of SBP.  She denies any fevers, chills.  She does endorse nausea and vomiting.  States her bowel movements been normal with normal color.  Urine has been normal without any changes in her color.  She states that she has noticed her skin tone is changed significantly over the past several weeks as well.  She denies any other associated symptoms.  No aggravating or mitigating factors.  She is taken no medications prior to arrival.    Past Medical History:  Diagnosis Date  . Hypoglycemia   . Seizures ([place])    single episode 2017    Patient Active Problem List   Diagnosis Date Noted  . Alcoholic hepatitis with ascites 08/21/2020  . Hepatitis, alcoholic, acute 98/33/8250  . Diffuse abdominal pain     History reviewed. No pertinent surgical history.   OB History   No obstetric history on file.     No family history on file.  Social History   Tobacco Use  . Smoking status: Never Smoker  . Smokeless tobacco: Never Used  Substance Use Topics  . Alcohol use: Not on file  . Drug use: Not on file    Home Medications Prior to Admission medications   Medication Sig Start Date End Date Taking? Authorizing Provider   MILK THISTLE PO Take 1 tablet by mouth in the morning and at bedtime.   Yes [provider]  Multiple Vitamin (MULTIVITAMIN WITH MINERALS) TABS tablet Take 1 tablet by mouth daily.   Yes [provider]  ondansetron (ZOFRAN-ODT) 4 MG disintegrating tablet Take 4 mg by mouth every 8 (eight) hours as needed for nausea or vomiting.  03/07/19  Yes [provider]  potassium chloride (KLOR-CON) 10 MEQ tablet Take 10 mEq by mouth daily. 07/11/20  Yes [provider]    Allergies    Nsaids and Sulfa antibiotics  Review of Systems   Review of Systems  Constitutional: Negative for chills and fever.  HENT: Negative for congestion and ear pain.   Respiratory: Negative for shortness of breath.   Cardiovascular: Negative for chest pain.  Gastrointestinal: Positive for abdominal pain. Negative for blood in stool, constipation, diarrhea, nausea and vomiting.  Endocrine: Negative for polydipsia and polyuria.  Genitourinary: Negative for difficulty urinating, dysuria and hematuria.  Musculoskeletal: Negative for myalgias.  Skin:       Jaundice  Neurological: Negative for dizziness and headaches.    Physical Exam Updated Vital Signs BP 114/68   Pulse 97   Temp 98.3 F (36.8 C) (Oral)   Resp 14   Ht 5\' 7"  (1.702 m)   Wt 65.3 kg   SpO2 96%   BMI 22.55 kg/m  Physical Exam Vitals and nursing note reviewed.  Constitutional:      General: She is in acute distress.     Comments: Patient states she is in severe pain.  She is not toxic appearing but appears chronically ill.  HENT:     Head: Normocephalic and atraumatic.     Nose: Nose normal.  Eyes:     General: Scleral icterus present.  Cardiovascular:     Rate and Rhythm: Normal rate and regular rhythm.     Pulses: Normal pulses.     Heart sounds: Normal heart sounds.  Pulmonary:     Effort: Pulmonary effort is normal. No respiratory distress.     Breath sounds: Normal breath sounds. No wheezing.      Comments: Lungs are clear to auscultation all fields.  Normal work of breathing, no tachypnea. Abdominal:     General: There is distension.     Palpations: Abdomen is soft.     Tenderness: There is abdominal tenderness. There is no guarding or rebound.     Comments: Diffuse abdominal TTP.  No focal tenderness.  Fluid wave is positive.   Musculoskeletal:     Cervical back: Normal range of motion.     Right lower leg: No edema.     Left lower leg: No edema.  Skin:    General: Skin is warm and dry.     Capillary Refill: Capillary refill takes less than 2 seconds.     Comments: Patient is severely jaundiced  Bruise of left abdominal wall  Neurological:     Mental Status: She is alert. Mental status is at baseline.  Psychiatric:        Mood and Affect: Mood normal.        Behavior: Behavior normal.     ED Results / Procedures / Treatments   Labs (all labs ordered are listed, but only abnormal results are displayed) Labs Reviewed  COMPREHENSIVE METABOLIC PANEL - Abnormal; Notable for the following components:      Result Value   Sodium 134 (*)    Potassium 3.2 (*)    Chloride 96 (*)    Glucose, Bld 113 (*)    BUN <5 (*)    Albumin 2.4 (*)    AST 163 (*)    Alkaline Phosphatase 140 (*)    Total Bilirubin 19.3 (*)    All other components within normal limits  CBC WITH DIFFERENTIAL/PLATELET - Abnormal; Notable for the following components:   MCV 100.5 (*)    MCH 34.2 (*)    RDW 16.3 (*)    Monocytes Absolute 1.1 (*)    Abs Immature Granulocytes 0.08 (*)    All other components within normal limits  URINALYSIS, ROUTINE W REFLEX MICROSCOPIC - Abnormal; Notable for the following components:   Color, Urine AMBER (*)    Specific Gravity, Urine >1.046 (*)    Bilirubin Urine MODERATE (*)    Nitrite POSITIVE (*)    Bacteria, UA RARE (*)    All other components within normal limits  PROTIME-INR - Abnormal; Notable for the following components:   Prothrombin Time 19.4 (*)     INR 1.7 (*)    All other components within normal limits  APTT - Abnormal; Notable for the following components:   aPTT 40 (*)    All other components within normal limits  AMMONIA - Abnormal; Notable for the following components:   Ammonia 63 (*)    All other components within normal limits  RESP PANEL  BY RT-PCR (FLU A&B, COVID) ARPGX2  BODY FLUID CULTURE  GRAM STAIN  LIPASE, BLOOD  LACTATE DEHYDROGENASE  LACTATE DEHYDROGENASE, PLEURAL OR PERITONEAL FLUID  GLUCOSE, PLEURAL OR PERITONEAL FLUID  PROTEIN, PLEURAL OR PERITONEAL FLUID  ALBUMIN, PLEURAL OR PERITONEAL FLUID  BODY FLUID CELL COUNT WITH DIFFERENTIAL  HIV ANTIBODY (ROUTINE TESTING W REFLEX)  PROTIME-INR  COMPREHENSIVE METABOLIC PANEL  CBC WITH DIFFERENTIAL/PLATELET  AFP TUMOR MARKER  PHOSPHORUS  MAGNESIUM  RAPID URINE DRUG SCREEN, HOSP PERFORMED  HEPATITIS PANEL, ACUTE  I-STAT BETA HCG BLOOD, ED ([facility], [facility], [facility] ONLY)    EKG EKG Interpretation  Date/Time:  Wednesday August 21 2020 14:20:03 EST Ventricular Rate:  102 PR Interval:    QRS Duration: 89 QT Interval:  377 QTC Calculation: 492 R Axis:   -3 Text Interpretation: Sinus tachycardia Low voltage, precordial leads Borderline prolonged QT interval Confirmed by Lennice Sites [phone]) on 08/21/2020 4:05:39 PM   Radiology CT ABDOMEN PELVIS W CONTRAST  Result Date: 08/21/2020 CLINICAL DATA:  Abdominal pain, cirrhosis with generalized abdominal pain, concern for SBP EXAM: CT ABDOMEN AND PELVIS WITH CONTRAST TECHNIQUE: Multidetector CT imaging of the abdomen and pelvis was performed using the standard protocol following bolus administration of intravenous contrast. CONTRAST:  129mL OMNIPAQUE IOHEXOL 300 MG/ML  SOLN COMPARISON:  None. FINDINGS: Lower chest: Atelectatic changes are present in the lung bases. Normal heart size. No pericardial effusion. Hepatobiliary: Diffusely heterogeneous and nodular liver compatible with provided history of cirrhosis. Intermediate  attenuation (70 HU) lesion is seen in the anterior segment 4 (3/15) measuring 1.8 by 1.8 cm in size, indeterminate on this single phase CT examination. Geographic regions of more focal hyperattenuation seen in the periphery of the right lobe and left lobe liver likely related to altered perfusion dynamics. These may result in some artifactual conspicuity particularly in the anterior left lobe (3/11) versus developing regenerative nodules. No intrahepatic biliary dilatation. Gallbladder is fairly distended with multiple layering gallstones and few prominent gallbladder folds. No gallbladder wall thickening is seen however. No biliary ductal dilatation or intraductal gallstones are seen. Pancreas: No pancreatic ductal dilatation or surrounding inflammatory changes. Spleen: Splenomegaly.  No concerning focal splenic lesion. Adrenals/Urinary Tract: Normal adrenal glands. Kidneys are normally located with symmetric enhancement. No suspicious renal lesion, urolithiasis or hydronephrosis. The urinary bladder is unremarkable for the degree of distention Stomach/Bowel: Distal esophagus, stomach and duodenal sweep are unremarkable. No small bowel wall thickening or dilatation. No evidence of obstruction. A normal appendix is visualized. No colonic dilatation or wall thickening. Vascular/Lymphatic: Extensive upper abdominal venous collateralization including reconstitution tortuosity of the umbilical vein, caput medusa a of the anterior abdominal wall, right slightly more pronounced than left, and additional collateralization involving the SMV and renal vein circulation. Mild narrowing of the intrahepatic IVC likely related to cirrhotic changes. Hepatic veins are poorly opacified. Abdominal aorta is normal caliber. No acute luminal abnormality. Reproductive: Anteverted uterus.  No concerning adnexal lesions. Other: Moderate to large volume ascites. No discernible rim enhancing loculation or collection is seen. No free air. No  bowel containing hernias small fat containing umbilical hernia. Vascular collaterals as above. Musculoskeletal: Multilevel degenerative changes are present in the imaged portions of the spine. No acute osseous abnormality or suspicious osseous lesion. IMPRESSION: 1. Diffusely heterogeneous and nodular liver compatible with provided history of cirrhosis. Geographic regions of hyperattenuation seen in the periphery of the right lobe and left lobe liver likely related to altered perfusion dynamics. These may result in some artifactual conspicuity particularly in the anterior  left lobe liver (3/11) versus developing regenerative nodules. An additional indeterminate 1.8 cm nodule is seen in the anterior segment 4. Consider further evaluation with nonemergent liver MRI. 2. Additional stigmata of cirrhosis including upper abdominal venous collateralization including reconstitution, tortuosity of the umbilical vein, caput medusa, and additional collateralization involving the SMV and right renal vein circulation. Mild narrowing of the intrahepatic IVC likely related to cirrhotic changes. 3. Splenomegaly. 4. Moderate to large volume ascites. No discernible rim enhancing loculation or collection is seen though spontaneous bacterial peritonitis cannot be fully excluded on an imaging basis. Consider fluid sampling. 5. Cholelithiasis with gallbladder distention. No gallbladder wall thickening or biliary dilatation is seen. If there is persisting concern for acute cholecystitis however, consider ultrasound or HIDA. Electronically Signed   By: Lovena Le M.D.   On: 08/21/2020 17:01   DG Chest Portable 1 View  Result Date: 08/21/2020 CLINICAL DATA:  Shortness of breath. EXAM: PORTABLE CHEST 1 VIEW COMPARISON:  No pertinent prior exams available for comparison. FINDINGS: Heart size within normal limits. No appreciable airspace consolidation or pulmonary edema. No evidence of pleural effusion or pneumothorax. Elevation of the  right hemidiaphragm. No evidence of acute bony abnormality. IMPRESSION: No evidence of acute cardiopulmonary abnormality. Elevation of the right hemidiaphragm, nonspecific but possibly reflecting underlying hepatomegaly. Electronically Signed   By: Kellie Simmering DO   On: 08/21/2020 15:27    Procedures .Paracentesis  Date/Time: 08/21/2020 7:26 PM Performed by: Tedd Sias, PA Authorized by: Tedd Sias, PA   Consent:    Consent obtained:  Verbal   Consent given by:  Patient   Risks discussed:  Bleeding, bowel perforation, infection and pain   Alternatives discussed:  No treatment, delayed treatment, observation and referral Pre-procedure details:    Procedure purpose:  Diagnostic   Preparation: Patient was prepped and draped in usual sterile fashion   Anesthesia (see MAR for exact dosages):    Anesthesia method:  Local infiltration   Local anesthetic:  Lidocaine 1% w/o epi Procedure details:    Needle gauge:  22   Ultrasound guidance: yes     Puncture site:  L lower quadrant   Fluid removed amount:  60   Fluid appearance:  Yellow   Dressing:  4x4 sterile gauze (dermabond) Post-procedure details:    Patient tolerance of procedure:  Tolerated well, no immediate complications  .Critical Care Performed by: Tedd Sias, PA Authorized by: Tedd Sias, PA   Critical care provider statement:    Critical care time (minutes):  45   Critical care was necessary to treat or prevent imminent or life-threatening deterioration of the following conditions: Hepatic failure, possible SBP.   Critical care was time spent personally by me on the following activities:  Discussions with consultants, evaluation of patient's response to treatment, examination of patient, ordering and performing treatments and interventions, ordering and review of laboratory studies, ordering and review of radiographic studies, pulse oximetry, re-evaluation of patient's condition, obtaining history from  patient or surrogate and review of old charts   (including critical care time)  Medications Ordered in ED Medications  fentaNYL (SUBLIMAZE) injection 100 mcg (100 mcg Intravenous Given 08/21/20 1900)  morphine 4 MG/ML injection 4 mg (has no administration in time range)  phytonadione (VITAMIN K) tablet 5 mg (has no administration in time range)  lactulose (CHRONULAC) 10 GM/15ML solution 30 g (has no administration in time range)  albumin human 25 % solution 12.5 g (has no administration in time range)  ondansetron (  ZOFRAN) tablet 4 mg (has no administration in time range)    Or  ondansetron (ZOFRAN) injection 4 mg (has no administration in time range)  furosemide (LASIX) tablet 20 mg (has no administration in time range)  spironolactone (ALDACTONE) tablet 100 mg (has no administration in time range)  potassium chloride SA (KLOR-CON) CR tablet 40 mEq (has no administration in time range)  prednisoLONE tablet 30 mg (has no administration in time range)  LORazepam (ATIVAN) tablet 1-4 mg (has no administration in time range)    Or  LORazepam (ATIVAN) injection 1-4 mg (has no administration in time range)  thiamine tablet 100 mg (has no administration in time range)    Or  thiamine (B-1) injection 100 mg (has no administration in time range)  folic acid (FOLVITE) tablet 1 mg (has no administration in time range)  multivitamin with minerals tablet 1 tablet (has no administration in time range)  cefTRIAXone (ROCEPHIN) 2 g in sodium chloride 0.9 % 100 mL IVPB (has no administration in time range)  melatonin tablet 3 mg (has no administration in time range)  morphine 4 MG/ML injection 4 mg (4 mg Intravenous Given 08/21/20 1510)  cefTRIAXone (ROCEPHIN) 1 g in sodium chloride 0.9 % 100 mL IVPB (0 g Intravenous Stopped 08/21/20 1608)  iohexol (OMNIPAQUE) 300 MG/ML solution 100 mL (100 mLs Intravenous Contrast Given 08/21/20 1642)  lidocaine (PF) (XYLOCAINE) 1 % injection 20 mL (5 mLs Infiltration  Given 08/21/20 1906)    ED Course  I have reviewed the triage vital signs and the nursing notes.  Pertinent labs & imaging results that were available during my care of the patient were reviewed by me and considered in my medical decision making (see chart for details).  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  She is not followed by a hepatologist or gastroenterologist.  She was seen by hepatologist in [place] 2 years ago has not been seen or establish care with one since.  She continues to drink alcohol last been drinking was approximately 1 week ago.  She will go through as much as 1/5 of liquor in 2 days.  She is presented today with worsening jaundice and worsening abdominal pain that is generalized severe sharp and achy for the past 5 days. She has not been taking any medicine for her symptoms.  She is without any pain medicine per her.  Patient with CMP with notable elevation in bilirubin at 19.3 indicative of hepatic failure.  Per [facility] patient had lab work 6 weeks ago which had bilirubin of 4.7 and AST 319, ALT 77 Transaminitis is more mild today but with similar ratio consistent with alcohol use.  CBC without leukocytosis or significant anemia she does have macrocytosis  Covid test negative.  Ammonia very mildly elevated at 63, coags obtained.  LDH within normal limits.  Urinalysis with rare bacteria nitrate positive she is having no urinary symptoms I have low suspicion for this being consistent with a urinary tract infection.  Chest x-ray reviewed by myself agree of radiology read there is no acute infiltrate or abnormality consistent with infection.  Right hemidiaphragm is elevated likely secondary to cirrhosis.  CT abdomen pelvis with no acute findings is not particularly consistent with SBP however cannot rule out.  Will go forward with paracentesis for diagnostic evaluation patient given 1 g of Rocephin IV.   Clinical Course as of Aug 21 1941  Wed Aug 21, 2020  1712 Discussed with Dr. Rush Landmark of GI  Recommends admission. I also discussed with IR who will do therapeutic tap tomorrow.As pt sees [facility] --> [facility] to follow. Discussed with Dr. Si Gaul who recommends holding steroids for right now. I conveyed labs and CT findings along with clinical exam. [facility] will see pt tomorrow morning.   Discussed with Dr. Roosevelt Locks at 6:48PM who will admit   [WF]    Clinical Course User Index [WF] Tedd Sias, PA   MDM Rules/Calculators/A&P                          Patient care transferred to Dr. Roosevelt Locks who will follow up on body fluid evaluation.  Final Clinical Impression(s) / ED Diagnoses Final diagnoses:  Diffuse abdominal pain    Rx / DC Orders ED Discharge Orders    None       Tedd Sias, [place] 08/21/20 1947    Tedd Sias, [place] 08/21/20 1947    Lennice Sites, DO 08/21/20 2328

## 2020-08-21 NOTE — ED Notes (Signed)
Got patient on the monitor patient is resting with call bell in reach  ?

## 2020-08-21 NOTE — ED Notes (Signed)
Paracentesis performed by Ova Freshwater, PA. Pt tolerated well.

## 2020-08-22 ENCOUNTER — Other Ambulatory Visit: Payer: Self-pay

## 2020-08-22 ENCOUNTER — Inpatient Hospital Stay (HOSPITAL_COMMUNITY): Payer: BC Managed Care – PPO

## 2020-08-22 ENCOUNTER — Encounter (HOSPITAL_COMMUNITY): Payer: Self-pay | Admitting: Internal Medicine

## 2020-08-22 DIAGNOSIS — K7011 Alcoholic hepatitis with ascites: Secondary | ICD-10-CM | POA: Diagnosis not present

## 2020-08-22 HISTORY — PX: IR PARACENTESIS: IMG2679

## 2020-08-22 LAB — GRAM STAIN

## 2020-08-22 LAB — BODY FLUID CELL COUNT WITH DIFFERENTIAL
Eos, Fluid: 0 %
Lymphs, Fluid: 26 %
Monocyte-Macrophage-Serous Fluid: 73 % (ref 50–90)
Neutrophil Count, Fluid: 1 % (ref 0–25)
Total Nucleated Cell Count, Fluid: 125 cu mm (ref 0–1000)

## 2020-08-22 LAB — CBC WITH DIFFERENTIAL/PLATELET
Abs Immature Granulocytes: 0.04 10*3/uL (ref 0.00–0.07)
Basophils Absolute: 0.1 10*3/uL (ref 0.0–0.1)
Basophils Relative: 1 %
Eosinophils Absolute: 0 10*3/uL (ref 0.0–0.5)
Eosinophils Relative: 1 %
HCT: 33.7 % — ABNORMAL LOW (ref 36.0–46.0)
Hemoglobin: 12 g/dL (ref 12.0–15.0)
Immature Granulocytes: 1 %
Lymphocytes Relative: 7 %
Lymphs Abs: 0.4 10*3/uL — ABNORMAL LOW (ref 0.7–4.0)
MCH: 34.7 pg — ABNORMAL HIGH (ref 26.0–34.0)
MCHC: 35.6 g/dL (ref 30.0–36.0)
MCV: 97.4 fL (ref 80.0–100.0)
Monocytes Absolute: 0.3 10*3/uL (ref 0.1–1.0)
Monocytes Relative: 5 %
Neutro Abs: 5 10*3/uL (ref 1.7–7.7)
Neutrophils Relative %: 85 %
Platelets: 127 10*3/uL — ABNORMAL LOW (ref 150–400)
RBC: 3.46 MIL/uL — ABNORMAL LOW (ref 3.87–5.11)
RDW: 16.1 % — ABNORMAL HIGH (ref 11.5–15.5)
WBC: 5.8 10*3/uL (ref 4.0–10.5)
nRBC: 0 % (ref 0.0–0.2)

## 2020-08-22 LAB — PHOSPHORUS: Phosphorus: 3.5 mg/dL (ref 2.5–4.6)

## 2020-08-22 LAB — COMPREHENSIVE METABOLIC PANEL
ALT: 27 U/L (ref 0–44)
AST: 113 U/L — ABNORMAL HIGH (ref 15–41)
Albumin: 2.2 g/dL — ABNORMAL LOW (ref 3.5–5.0)
Alkaline Phosphatase: 114 U/L (ref 38–126)
Anion gap: 10 (ref 5–15)
BUN: <5 mg/dL — ABNORMAL LOW (ref 6–20)
CO2: 22 mmol/L (ref 22–32)
Calcium: 8.4 mg/dL — ABNORMAL LOW (ref 8.9–10.3)
Chloride: 98 mmol/L (ref 98–111)
Creatinine, Ser: 0.6 mg/dL (ref 0.44–1.00)
GFR, Estimated: >60 mL/min (ref >60)
Glucose, Bld: 171 mg/dL — ABNORMAL HIGH (ref 70–99)
Potassium: 4.1 mmol/L (ref 3.5–5.1)
Sodium: 130 mmol/L — ABNORMAL LOW (ref 135–145)
Total Bilirubin: 15.5 mg/dL — ABNORMAL HIGH (ref 0.3–1.2)
Total Protein: 6.6 g/dL (ref 6.5–8.1)

## 2020-08-22 LAB — HEPATITIS PANEL, ACUTE
HCV Ab: NONREACTIVE
Hep A IgM: NONREACTIVE
Hep B C IgM: NONREACTIVE
Hepatitis B Surface Ag: NONREACTIVE

## 2020-08-22 LAB — PROTIME-INR
INR: 1.8 — ABNORMAL HIGH (ref 0.8–1.2)
Prothrombin Time: 20.6 seconds — ABNORMAL HIGH (ref 11.4–15.2)

## 2020-08-22 LAB — PROTEIN, PLEURAL OR PERITONEAL FLUID: Total protein, fluid: <3 g/dL

## 2020-08-22 LAB — ALBUMIN, PLEURAL OR PERITONEAL FLUID: Albumin, Fluid: <1 g/dL

## 2020-08-22 LAB — LACTATE DEHYDROGENASE, PLEURAL OR PERITONEAL FLUID: LD, Fluid: 16 U/L (ref 3–23)

## 2020-08-22 LAB — GLUCOSE, PLEURAL OR PERITONEAL FLUID: Glucose, Fluid: 206 mg/dL

## 2020-08-22 LAB — MAGNESIUM: Magnesium: 1.8 mg/dL (ref 1.7–2.4)

## 2020-08-22 LAB — HIV ANTIBODY (ROUTINE TESTING W REFLEX): HIV Screen 4th Generation wRfx: NONREACTIVE

## 2020-08-22 MED ORDER — LIDOCAINE HCL 1 % IJ SOLN
INTRAMUSCULAR | Status: DC | PRN
  Administered 2020-08-22: 15 mL

## 2020-08-22 MED ORDER — LIDOCAINE HCL 1 % IJ SOLN
INTRAMUSCULAR | Status: AC
  Filled 2020-08-22: qty 20

## 2020-08-22 NOTE — Progress Notes (Signed)
New Admission Note: ? Arrival Method: Stretcher  Mental Orientation: AxOx4 Telemetry: Box 6 Assessment: Completed Skin: Refer to flowsheet IV: Right forearm  Pain: 0/10 Tubes: None  Safety Measures: Safety Fall Prevention Plan discussed with patient. Admission: Completed [facility] [place] Orientation: Patient has been orientated to the room, unit and the staff. Family: None  Orders have been reviewed and are being implemented. Will continue to monitor the patient. Call light has been placed within reach and bed alarm has been activated.  ? Milagros Loll, RN  Phone Number: [phone]

## 2020-08-22 NOTE — Plan of Care (Signed)
  Problem: Education: Goal: Knowledge of General Education information will improve Description Including pain rating scale, medication(s)/side effects and non-pharmacologic comfort measures Outcome: Progressing   

## 2020-08-22 NOTE — ED Notes (Signed)
Message sent to pharmacy to re-time the Vitamin K and lactulose and send to [facility]M as well as resend the melatonin which accidentally fell to floor after pt asked me to hold it for later.

## 2020-08-22 NOTE — Progress Notes (Signed)
PROGRESS NOTE    Jill Farmer  XBM:[id number] DOB: 11-Feb-1981 DOA: 08/21/2020 PCP: Patient, No Pcp Per   Brief Narrative:  HPI: Jill Farmer is a 39 y.o. female with medical history significant of alcoholic cirrhosis diagnosed in 2017, bilateral knee bursitis, presented with worsening of jaundice and abdominal pain.  Patient was diagnosed alcoholic cirrhosis in [place] in 2015, she has been following with her hepatologist until 2019 in [place].  She was once on the transplant list.  But patient reported that she was not prescribed with diuresis medications.  Since 2019, patient lost her follow-ups and in 2020, she moved to [place] area.  Patient claimed that she has been restrained  from alcohol use since 2019, but for the last 3 to 4 months she started to pick up the binge drinking again, drinks vodka 2-3 times a week and last drink was 2 weeks ago.  5 days ago she started to feel sharp like abdominal pain, generalized, associated with nausea, she tried to take Zofran occasionally with minimum help.  She denied any diarrhea and she described her stool as hard.  Denied any fever or chills.  Meantime, her abdomen becomes more distended. ED Course: Diagnostic paracentesis was done, results pending.  Lab work showed significant bilirubinemia but no leukocytosis.  Assessment & Plan:   Active Problems:   Alcoholic hepatitis with ascites   Hepatitis, alcoholic, acute   Acute (probably on chronic) decompensated liver failure/cirrhosis/acute alcoholic hepatitis -Elevated ammonia level but no symptoms signs of encephalopathy.  Patient underwent diagnostic paracentesis in the ED, fluid analysis negative for SBP.  No tenderness on abdominal exam.  No further need of antibiotics.  We will discontinue that. - MELD=26, associated with 3 months mortality around 19.6%.  [facility] on board.  Patient is scheduled for therapeutic paracentesis today.  We will continue prednisolone.  LFTs improved slightly  today.  Continue lactulose, Lasix and Aldactone.  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  No signs of bleeding.  Alcohol abuse -As of now, nurse no symptoms signs of acute withdrawal.  According to her her last dose was 2 weeks ago.  Continue CIWA protocol.  Cessation counseling provided.  Consulting TOC to provide resources.  DVT prophylaxis: SCDs Start: 08/21/20 1905   Code Status: Full Code  Family Communication:  None present at bedside.  Plan of care discussed with patient in length and he verbalized understanding and agreed with it.  Status is: Inpatient  Remains inpatient appropriate because:Inpatient level of care appropriate due to severity of illness   Dispo: The patient is from: Home              Anticipated d/c is to: Home              Anticipated d/c date is: 1 day              Patient currently is not medically stable to d/c.        Estimated body mass index is 22.55 kg/m as calculated from the following:   Height as of this encounter: 5\' 7"  (1.702 m).   Weight as of this encounter: 65.3 kg.      Nutritional status:               Consultants:   GI  Procedures:   Diagnostic paracentesis  Antimicrobials:  Anti-infectives (From admission, onward)   Start     Dose/Rate Route Frequency Ordered Stop   08/22/20 1500  cefTRIAXone (ROCEPHIN) 2 g in sodium chloride  0.9 % 100 mL IVPB        2 g 200 mL/hr over 30 Minutes Intravenous Every 24 hours 08/21/20 1930     08/21/20 1500  cefTRIAXone (ROCEPHIN) 1 g in sodium chloride 0.9 % 100 mL IVPB        1 g 200 mL/hr over 30 Minutes Intravenous  Once 08/21/20 1452 08/21/20 1608         Subjective: Patient seen and examined.  Dr. Watt Climes of GI was at the bedside.  Patient denied any abdominal pain but complaint of abdominal fullness.  No other complaint.  Very emotional and crying and regretting that she started drinking again.  Objective: Vitals:   08/22/20 0800 08/22/20 1100 08/22/20 1143 08/22/20  1237  BP: 109/74 118/74 116/76 120/71  Pulse: 97   99  Resp: 18   18  Temp: 98.9 F (37.2 C)   99.1 F (37.3 C)  TempSrc: Oral     SpO2: 93%   96%  Weight:      Height:        Intake/Output Summary (Last 24 hours) at 08/22/2020 1302 Last data filed at 08/22/2020 0958 Gross per 24 hour  Intake 829.35 ml  Output 100 ml  Net 729.35 ml   Filed Weights   08/21/20 1148 08/22/20 0132  Weight: 65.3 kg 65.3 kg    Examination:  General exam: Appears calm and comfortable  Respiratory system: Clear to auscultation. Respiratory effort normal. Cardiovascular system: S1 & S2 heard, RRR. No JVD, murmurs, rubs, gallops or clicks. No pedal edema. Gastrointestinal system: Abdomen is massively distended but soft and nontender. No organomegaly or masses felt. Normal bowel sounds heard. Central nervous system: Alert and oriented. No focal neurological deficits. Extremities: Symmetric 5 x 5 power. Skin: No rashes, lesions or ulcers Psychiatry: Judgement and insight appear normal. Mood & affect appropriate.    Data Reviewed: I have personally reviewed following labs and imaging studies  CBC: Recent Labs  Lab 08/21/20 1215 08/22/20 0423  WBC 8.8 5.8  NEUTROABS 6.3 5.0  HGB 13.7 12.0  HCT 40.3 33.7*  MCV 100.5* 97.4  PLT 165 353*   Basic Metabolic Panel: Recent Labs  Lab 08/21/20 1215 08/22/20 0423  NA 134* 130*  K 3.2* 4.1  CL 96* 98  CO2 25 22  GLUCOSE 113* 171*  BUN <5* <5*  CREATININE 0.64 0.60  CALCIUM 8.9 8.4*  MG  --  1.8  PHOS  --  3.5   GFR: Estimated Creatinine Clearance: 93.6 mL/min (by C-G formula based on SCr of 0.6 mg/dL). Liver Function Tests: Recent Labs  Lab 08/21/20 1215 08/22/20 0423  AST 163* 113*  ALT 38 27  ALKPHOS 140* 114  BILITOT 19.3* 15.5*  PROT 7.8 6.6  ALBUMIN 2.4* 2.2*   Recent Labs  Lab 08/21/20 1215  LIPASE 47   Recent Labs  Lab 08/21/20 1514  AMMONIA 63*   Coagulation Profile: Recent Labs  Lab 08/21/20 1514  08/22/20 0423  INR 1.7* 1.8*   Cardiac Enzymes: No results for input(s): CKTOTAL, CKMB, CKMBINDEX, TROPONINI in the last 168 hours. BNP (last 3 results) No results for input(s): PROBNP in the last 8760 hours. HbA1C: No results for input(s): HGBA1C in the last 72 hours. CBG: No results for input(s): GLUCAP in the last 168 hours. Lipid Profile: No results for input(s): CHOL, HDL, LDLCALC, TRIG, CHOLHDL, LDLDIRECT in the last 72 hours. Thyroid Function Tests: No results for input(s): TSH, T4TOTAL, FREET4, T3FREE, THYROIDAB in the last  72 hours. Anemia Panel: No results for input(s): VITAMINB12, FOLATE, FERRITIN, TIBC, IRON, RETICCTPCT in the last 72 hours. Sepsis Labs: No results for input(s): PROCALCITON, LATICACIDVEN in the last 168 hours.  Recent Results (from the past 240 hour(s))  Resp Panel by RT-PCR (Flu A&B, Covid) Nasopharyngeal Swab     Status: None   Collection Time: 08/21/20  3:15 PM   Specimen: Nasopharyngeal Swab; Nasopharyngeal(NP) swabs in vial transport medium  Result Value Ref Range Status   SARS Coronavirus 2 by RT PCR NEGATIVE NEGATIVE Final    Comment: (NOTE) SARS-CoV-2 target nucleic acids are NOT DETECTED.  The SARS-CoV-2 RNA is generally detectable in upper respiratory specimens during the acute phase of infection. The lowest concentration of SARS-CoV-2 viral copies this assay can detect is 138 copies/mL. A negative result does not preclude SARS-Cov-2 infection and should not be used as the sole basis for treatment or other patient management decisions. A negative result may occur with  improper specimen collection/handling, submission of specimen other than nasopharyngeal swab, presence of viral mutation(s) within the areas targeted by this assay, and inadequate number of viral copies(<138 copies/mL). A negative result must be combined with clinical observations, patient history, and epidemiological information. The expected result is Negative.  Fact  Sheet for Patients:  EntrepreneurPulse.com.au  Fact Sheet for Healthcare Providers:  IncredibleEmployment.be  This test is no t yet approved or cleared by the [place] FDA and  has been authorized for detection and/or diagnosis of SARS-CoV-2 by FDA under an Emergency Use Authorization (EUA). This EUA will remain  in effect (meaning this test can be used) for the duration of the COVID-19 declaration under Section 564(b)(1) of the Act, 21 U.S.C.section 360bbb-3(b)(1), unless the authorization is terminated  or revoked sooner.       Influenza A by PCR NEGATIVE NEGATIVE Final   Influenza B by PCR NEGATIVE NEGATIVE Final    Comment: (NOTE) The Xpert Xpress SARS-CoV-2/FLU/RSV plus assay is intended as an aid in the diagnosis of influenza from Nasopharyngeal swab specimens and should not be used as a sole basis for treatment. Nasal washings and aspirates are unacceptable for Xpert Xpress SARS-CoV-2/FLU/RSV testing.  Fact Sheet for Patients: EntrepreneurPulse.com.au  Fact Sheet for Healthcare Providers: IncredibleEmployment.be  This test is not yet approved or cleared by the [place] FDA and has been authorized for detection and/or diagnosis of SARS-CoV-2 by FDA under an Emergency Use Authorization (EUA). This EUA will remain in effect (meaning this test can be used) for the duration of the COVID-19 declaration under Section 564(b)(1) of the Act, 21 U.S.C. section 360bbb-3(b)(1), unless the authorization is terminated or revoked.  Performed at [facility], [place] [address] [place], [place] [address]   Gram stain     Status: None   Collection Time: 08/21/20  7:05 PM   Specimen: Peritoneal Cavity; Body Fluid  Result Value Ref Range Status   Specimen Description PERITONEAL CAVITY  Final   Special Requests FLUID  Final   Gram Stain   Final    WBC PRESENT, PREDOMINANTLY MONONUCLEAR NO ORGANISMS  SEEN CYTOSPIN SMEAR Performed at [facility], [address] [place], [place] [address]    Report Status 08/22/2020 FINAL  Final  Culture, body fluid-bottle     Status: None (Preliminary result)   Collection Time: 08/21/20  7:05 PM   Specimen: Peritoneal Cavity  Result Value Ref Range Status   Specimen Description PERITONEAL CAVITY  Final   Special Requests FLUID  Final  Culture   Final    NO GROWTH < 12 HOURS Performed at [facility], [place] [address], [place], [place] [address]    Report Status PENDING  Incomplete      Radiology Studies: CT ABDOMEN PELVIS W CONTRAST  Result Date: 08/21/2020 CLINICAL DATA:  Abdominal pain, cirrhosis with generalized abdominal pain, concern for SBP EXAM: CT ABDOMEN AND PELVIS WITH CONTRAST TECHNIQUE: Multidetector CT imaging of the abdomen and pelvis was performed using the standard protocol following bolus administration of intravenous contrast. CONTRAST:  153mL OMNIPAQUE IOHEXOL 300 MG/ML  SOLN COMPARISON:  None. FINDINGS: Lower chest: Atelectatic changes are present in the lung bases. Normal heart size. No pericardial effusion. Hepatobiliary: Diffusely heterogeneous and nodular liver compatible with provided history of cirrhosis. Intermediate attenuation (70 HU) lesion is seen in the anterior segment 4 (3/15) measuring 1.8 by 1.8 cm in size, indeterminate on this single phase CT examination. Geographic regions of more focal hyperattenuation seen in the periphery of the right lobe and left lobe liver likely related to altered perfusion dynamics. These may result in some artifactual conspicuity particularly in the anterior left lobe (3/11) versus developing regenerative nodules. No intrahepatic biliary dilatation. Gallbladder is fairly distended with multiple layering gallstones and few prominent gallbladder folds. No gallbladder wall thickening is seen however. No biliary ductal dilatation or intraductal gallstones are seen. Pancreas: No  pancreatic ductal dilatation or surrounding inflammatory changes. Spleen: Splenomegaly.  No concerning focal splenic lesion. Adrenals/Urinary Tract: Normal adrenal glands. Kidneys are normally located with symmetric enhancement. No suspicious renal lesion, urolithiasis or hydronephrosis. The urinary bladder is unremarkable for the degree of distention Stomach/Bowel: Distal esophagus, stomach and duodenal sweep are unremarkable. No small bowel wall thickening or dilatation. No evidence of obstruction. A normal appendix is visualized. No colonic dilatation or wall thickening. Vascular/Lymphatic: Extensive upper abdominal venous collateralization including reconstitution tortuosity of the umbilical vein, caput medusa a of the anterior abdominal wall, right slightly more pronounced than left, and additional collateralization involving the SMV and renal vein circulation. Mild narrowing of the intrahepatic IVC likely related to cirrhotic changes. Hepatic veins are poorly opacified. Abdominal aorta is normal caliber. No acute luminal abnormality. Reproductive: Anteverted uterus.  No concerning adnexal lesions. Other: Moderate to large volume ascites. No discernible rim enhancing loculation or collection is seen. No free air. No bowel containing hernias small fat containing umbilical hernia. Vascular collaterals as above. Musculoskeletal: Multilevel degenerative changes are present in the imaged portions of the spine. No acute osseous abnormality or suspicious osseous lesion. IMPRESSION: 1. Diffusely heterogeneous and nodular liver compatible with provided history of cirrhosis. Geographic regions of hyperattenuation seen in the periphery of the right lobe and left lobe liver likely related to altered perfusion dynamics. These may result in some artifactual conspicuity particularly in the anterior left lobe liver (3/11) versus developing regenerative nodules. An additional indeterminate 1.8 cm nodule is seen in the anterior  segment 4. Consider further evaluation with nonemergent liver MRI. 2. Additional stigmata of cirrhosis including upper abdominal venous collateralization including reconstitution, tortuosity of the umbilical vein, caput medusa, and additional collateralization involving the SMV and right renal vein circulation. Mild narrowing of the intrahepatic IVC likely related to cirrhotic changes. 3. Splenomegaly. 4. Moderate to large volume ascites. No discernible rim enhancing loculation or collection is seen though spontaneous bacterial peritonitis cannot be fully excluded on an imaging basis. Consider fluid sampling. 5. Cholelithiasis with gallbladder distention. No gallbladder wall thickening or biliary dilatation is seen. If there is persisting concern  for acute cholecystitis however, consider ultrasound or HIDA. Electronically Signed   By: Lovena Le M.D.   On: 08/21/2020 17:01   DG Chest Portable 1 View  Result Date: 08/21/2020 CLINICAL DATA:  Shortness of breath. EXAM: PORTABLE CHEST 1 VIEW COMPARISON:  No pertinent prior exams available for comparison. FINDINGS: Heart size within normal limits. No appreciable airspace consolidation or pulmonary edema. No evidence of pleural effusion or pneumothorax. Elevation of the right hemidiaphragm. No evidence of acute bony abnormality. IMPRESSION: No evidence of acute cardiopulmonary abnormality. Elevation of the right hemidiaphragm, nonspecific but possibly reflecting underlying hepatomegaly. Electronically Signed   By: Kellie Simmering DO   On: 08/21/2020 15:27    Scheduled Meds: . lidocaine      . folic acid  1 mg Oral Daily  . furosemide  20 mg Oral Daily  . lactulose  30 g Oral Daily  . melatonin  3 mg Oral QHS  . multivitamin with minerals  1 tablet Oral Daily  . prednisoLONE  30 mg Oral BID  . spironolactone  100 mg Oral Daily  . thiamine  100 mg Oral Daily   Or  . thiamine  100 mg Intravenous Daily   Continuous Infusions: . albumin human 12.5 g  (08/22/20 0806)  . cefTRIAXone (ROCEPHIN)  IV       LOS: 1 day   Time spent: 36 minutes   Darliss Cheney, MD [facility]  08/22/2020, 1:02 PM   To contact the attending provider between 7A-7P or the covering provider during after hours 7P-7A, please log into the web site [email or web address].

## 2020-08-22 NOTE — Procedures (Signed)
PROCEDURE SUMMARY:  Successful US guided paracentesis from right abdomen.  Yielded 3.95 L of dark yellow fluid.  No immediate complications.  Pt tolerated well.   Specimen sent for labs.  EBL < 2 mL  Theresa Duty, NP 08/22/2020 1:53 PM

## 2020-08-22 NOTE — Consult Note (Signed)
Reason for Consult: Alcoholic hepatitis and cirrhosis Referring Physician: Hospital team  Jill Farmer is an 39 y.o. female.  HPI: Patient seen and examined in hospital computer chart reviewed and case discussed with the hospital team and it sounds like she was worse in [place] a few years ago did stop drinking for a short time but has been back drinking and her main complaint is symptomatic ascites her family history is negative for any liver disease and she did have an endoscopy in [place] but no mention of varices and she had not been on a beta-blocker and she has no other complaints and we discussed prednisone she had been on Antabuse in the past and did not like it and we answered all of her questions  Past Medical History:  Diagnosis Date  . Hypoglycemia   . Seizures ([place])    single episode 2017    History reviewed. No pertinent surgical history.  History reviewed. No pertinent family history.  Social History:  reports that she has never smoked. She has never used smokeless tobacco. No history on file for alcohol use and drug use.  Allergies:  Allergies  Allergen Reactions  . Nsaids Anaphylaxis  . Sulfa Antibiotics Other (See Comments)    Unknown    Medications: I have reviewed the patient's current medications.  Results for orders placed or performed during the hospital encounter of 08/21/20 (from the past 48 hour(s))  Comprehensive metabolic panel     Status: Abnormal   Collection Time: 08/21/20 12:15 PM  Result Value Ref Range   Sodium 134 (L) 135 - 145 mmol/L   Potassium 3.2 (L) 3.5 - 5.1 mmol/L   Chloride 96 (L) 98 - 111 mmol/L   CO2 25 22 - 32 mmol/L   Glucose, Bld 113 (H) 70 - 99 mg/dL    Comment: Glucose reference range applies only to samples taken after fasting for at least 8 hours.   BUN <5 (L) 6 - 20 mg/dL   Creatinine, Ser 0.64 0.44 - 1.00 mg/dL   Calcium 8.9 8.9 - 10.3 mg/dL   Total Protein 7.8 6.5 - 8.1 g/dL   Albumin 2.4 (L) 3.5 - 5.0 g/dL   AST 163  (H) 15 - 41 U/L   ALT 38 0 - 44 U/L   Alkaline Phosphatase 140 (H) 38 - 126 U/L   Total Bilirubin 19.3 (HH) 0.3 - 1.2 mg/dL    Comment: CRITICAL RESULT CALLED TO, READ BACK BY AND VERIFIED WITH: S.BERTRAND,RN 1414 12/21 CLARK,S    GFR, Estimated >60 >60 mL/min    Comment: (NOTE) Calculated using the CKD-EPI Creatinine Equation (2021)    Anion gap 13 5 - 15    Comment: Performed at [facility], [place] [address], [place], [place] [address]  Lipase, blood     Status: None   Collection Time: 08/21/20 12:15 PM  Result Value Ref Range   Lipase 47 11 - 51 U/L    Comment: Performed at [facility] [address] [place], [place] [address]  CBC with Diff     Status: Abnormal   Collection Time: 08/21/20 12:15 PM  Result Value Ref Range   WBC 8.8 4.0 - 10.5 K/uL   RBC 4.01 3.87 - 5.11 MIL/uL   Hemoglobin 13.7 12.0 - 15.0 g/dL   HCT 40.3 36 - 46 %   MCV 100.5 (H) 80.0 - 100.0 fL   MCH 34.2 (H) 26.0 - 34.0 pg   MCHC 34.0 30.0 -  36.0 g/dL   RDW 16.3 (H) 11.5 - 15.5 %   Platelets 165 150 - 400 K/uL   nRBC 0.0 0.0 - 0.2 %   Neutrophils Relative % 71 %   Neutro Abs 6.3 1.7 - 7.7 K/uL   Lymphocytes Relative 12 %   Lymphs Abs 1.0 0.7 - 4.0 K/uL   Monocytes Relative 13 %   Monocytes Absolute 1.1 (H) 0.1 - 1.0 K/uL   Eosinophils Relative 1 %   Eosinophils Absolute 0.1 0.0 - 0.5 K/uL   Basophils Relative 2 %   Basophils Absolute 0.1 0.0 - 0.1 K/uL   Immature Granulocytes 1 %   Abs Immature Granulocytes 0.08 (H) 0.00 - 0.07 K/uL    Comment: Performed at [facility] [address], [place], [place] [address]  Protime-INR     Status: Abnormal   Collection Time: 08/21/20  3:14 PM  Result Value Ref Range   Prothrombin Time 19.4 (H) 11.4 - 15.2 seconds   INR 1.7 (H) 0.8 - 1.2    Comment: (NOTE) INR goal varies based on device and disease states. Performed at [facility], [place] [address], [place], [place] [address]   APTT     Status: Abnormal   Collection Time:  08/21/20  3:14 PM  Result Value Ref Range   aPTT 40 (H) 24 - 36 seconds    Comment:        IF BASELINE aPTT IS ELEVATED, SUGGEST PATIENT RISK ASSESSMENT BE USED TO DETERMINE APPROPRIATE ANTICOAGULANT THERAPY. Performed at [facility], [place] [address], [place], [place] [address]   Lactate dehydrogenase     Status: None   Collection Time: 08/21/20  3:14 PM  Result Value Ref Range   LDH 178 98 - 192 U/L    Comment: Performed at [facility], [place] [address], [place], [place] [address]  Ammonia     Status: Abnormal   Collection Time: 08/21/20  3:14 PM  Result Value Ref Range   Ammonia 63 (H) 9 - 35 umol/L    Comment: Performed at [facility] [address], [place],  [address]  Resp Panel by RT-PCR (Flu A&B, Covid) Nasopharyngeal Swab     Status: None   Collection Time: 08/21/20  3:15 PM   Specimen: Nasopharyngeal Swab; Nasopharyngeal(NP) swabs in vial transport medium  Result Value Ref Range   SARS Coronavirus 2 by RT PCR NEGATIVE NEGATIVE    Comment: (NOTE) SARS-CoV-2 target nucleic acids are NOT DETECTED.  The SARS-CoV-2 RNA is generally detectable in upper respiratory specimens during the acute phase of infection. The lowest concentration of SARS-CoV-2 viral copies this assay can detect is 138 copies/mL. A negative result does not preclude SARS-Cov-2 infection and should not be used as the sole basis for treatment or other patient management decisions. A negative result may occur with  improper specimen collection/handling, submission of specimen other than nasopharyngeal swab, presence of viral mutation(s) within the areas targeted by this assay, and inadequate number of viral copies(<138 copies/mL). A negative result must be combined with clinical observations, patient history, and epidemiological information. The expected result is Negative.  Fact Sheet for Patients:  EntrepreneurPulse.com.au  Fact Sheet for Healthcare  Providers:  IncredibleEmployment.be  This test is no t yet approved or cleared by the [place] FDA and  has been authorized for detection and/or diagnosis of SARS-CoV-2 by FDA under an Emergency Use Authorization (EUA). This EUA will remain  in effect (meaning  this test can be used) for the duration of the COVID-19 declaration under Section 564(b)(1) of the Act, 21 U.S.C.section 360bbb-3(b)(1), unless the authorization is terminated  or revoked sooner.       Influenza A by PCR NEGATIVE NEGATIVE   Influenza B by PCR NEGATIVE NEGATIVE    Comment: (NOTE) The Xpert Xpress SARS-CoV-2/FLU/RSV plus assay is intended as an aid in the diagnosis of influenza from Nasopharyngeal swab specimens and should not be used as a sole basis for treatment. Nasal washings and aspirates are unacceptable for Xpert Xpress SARS-CoV-2/FLU/RSV testing.  Fact Sheet for Patients: EntrepreneurPulse.com.au  Fact Sheet for Healthcare Providers: IncredibleEmployment.be  This test is not yet approved or cleared by the [place] FDA and has been authorized for detection and/or diagnosis of SARS-CoV-2 by FDA under an Emergency Use Authorization (EUA). This EUA will remain in effect (meaning this test can be used) for the duration of the COVID-19 declaration under Section 564(b)(1) of the Act, 21 U.S.C. section 360bbb-3(b)(1), unless the authorization is terminated or revoked.  Performed at [facility], [place] [address], [place], [place] [address]   I-Stat Beta hCG blood, ED ([facility], [facility], [facility] only)     Status: None   Collection Time: 08/21/20  4:05 PM  Result Value Ref Range   I-stat hCG, quantitative <5.0 <5 mIU/mL   Comment 3            Comment:   GEST. AGE      CONC.  (mIU/mL)   <=1 WEEK        5 - 50     2 WEEKS       50 - 500     3 WEEKS       100 - 10,000     4 WEEKS     1,000 - 30,000        FEMALE AND NON-PREGNANT FEMALE:     LESS THAN 5  mIU/mL   Urinalysis, Routine w reflex microscopic Urine, Clean Catch     Status: Abnormal   Collection Time: 08/21/20  5:56 PM  Result Value Ref Range   Color, Urine AMBER (A) YELLOW    Comment: BIOCHEMICALS MAY BE AFFECTED BY COLOR   APPearance CLEAR CLEAR   Specific Gravity, Urine >1.046 (H) 1.005 - 1.030   pH 6.0 5.0 - 8.0   Glucose, UA NEGATIVE NEGATIVE mg/dL   Hgb urine dipstick NEGATIVE NEGATIVE   Bilirubin Urine MODERATE (A) NEGATIVE   Ketones, ur NEGATIVE NEGATIVE mg/dL   Protein, ur NEGATIVE NEGATIVE mg/dL   Nitrite POSITIVE (A) NEGATIVE   Leukocytes,Ua NEGATIVE NEGATIVE   RBC / HPF 0-5 0 - 5 RBC/hpf   WBC, UA 0-5 0 - 5 WBC/hpf   Bacteria, UA RARE (A) NONE SEEN   Squamous Epithelial / LPF 6-10 0 - 5   Mucus PRESENT     Comment: Performed at [facility], [address] [place], [place] [address]  Lactate dehydrogenase (pleural or peritoneal fluid)     Status: None   Collection Time: 08/21/20  7:05 PM  Result Value Ref Range   LD, Fluid 16 3 - 23 U/L    Comment: (NOTE) Results should be evaluated in conjunction with serum values    Fluid Type-FLDH PERITONEAL CAVITY     Comment: FLUID Performed at [facility] [address] [place], [place] [address]   Glucose, pleural or peritoneal fluid     Status: None   Collection Time:  08/21/20  7:05 PM  Result Value Ref Range   Glucose, Fluid 108 mg/dL    Comment: (NOTE) No normal range established for this test Results should be evaluated in conjunction with serum values    Fluid Type-FGLU PERITONEAL CAVITY     Comment: FLUID Performed at [facility] [address] [place], [place] [address]   Protein, pleural or peritoneal fluid     Status: None   Collection Time: 08/21/20  7:05 PM  Result Value Ref Range   Total protein, fluid <3.0 g/dL   Fluid Type-FTP PERITONEAL CAVITY     Comment: FLUID Performed at [facility], [place] [address], [place], [place] [address]   Albumin, pleural or  peritoneal fluid     Status: None   Collection Time: 08/21/20  7:05 PM  Result Value Ref Range   Albumin, Fluid <1.0 g/dL   Fluid Type-FALB PERITONEAL CAVITY     Comment: FLUID Performed at [facility], [place] [address], [place], [place] [address]   Gram stain     Status: None   Collection Time: 08/21/20  7:05 PM   Specimen: Peritoneal Cavity; Body Fluid  Result Value Ref Range   Specimen Description PERITONEAL CAVITY    Special Requests FLUID    Gram Stain      WBC PRESENT, PREDOMINANTLY MONONUCLEAR NO ORGANISMS SEEN CYTOSPIN SMEAR Performed at [facility], [address] [place], [place] [address]    Report Status 08/22/2020 FINAL   Body fluid cell count with differential     Status: Abnormal   Collection Time: 08/21/20  7:05 PM  Result Value Ref Range   Fluid Type-FCT PERITONEAL CAVITY     Comment: FLUID CORRECTED ON 12/01 AT 2058: PREVIOUSLY REPORTED AS Peritoneal    Color, Fluid YELLOW (A) YELLOW   Appearance, Fluid HAZY (A) CLEAR   Total Nucleated Cell Count, Fluid 109 0 - 1,000 cu mm   Neutrophil Count, Fluid 3 0 - 25 %   Lymphs, Fluid 65 %   Monocyte-Macrophage-Serous Fluid 32 (L) 50 - 90 %   Eos, Fluid 0 %    Comment: Performed at [facility], [place] [address], [place], [place] [address]  Culture, body fluid-bottle     Status: None (Preliminary result)   Collection Time: 08/21/20  7:05 PM   Specimen: Peritoneal Cavity  Result Value Ref Range   Specimen Description PERITONEAL CAVITY    Special Requests FLUID    Culture      NO GROWTH < 12 HOURS Performed at [facility], [place] [address], [place], [place] [address]    Report Status PENDING   Protime-INR     Status: Abnormal   Collection Time: 08/22/20  4:23 AM  Result Value Ref Range   Prothrombin Time 20.6 (H) 11.4 - 15.2 seconds   INR 1.8 (H) 0.8 - 1.2    Comment: (NOTE) INR goal varies based on device and disease states. Performed at [facility], [place] [address]  [place], [place] [address]   Comprehensive metabolic panel     Status: Abnormal   Collection Time: 08/22/20  4:23 AM  Result Value Ref Range   Sodium 130 (L) 135 - 145 mmol/L   Potassium 4.1 3.5 - 5.1 mmol/L   Chloride 98 98 - 111 mmol/L   CO2 22 22 - 32 mmol/L   Glucose, Bld 171 (H) 70 - 99 mg/dL    Comment: Glucose reference  range applies only to samples taken after fasting for at least 8 hours.   BUN <5 (L) 6 - 20 mg/dL   Creatinine, Ser 0.60 0.44 - 1.00 mg/dL   Calcium 8.4 (L) 8.9 - 10.3 mg/dL   Total Protein 6.6 6.5 - 8.1 g/dL   Albumin 2.2 (L) 3.5 - 5.0 g/dL   AST 113 (H) 15 - 41 U/L   ALT 27 0 - 44 U/L   Alkaline Phosphatase 114 38 - 126 U/L   Total Bilirubin 15.5 (H) 0.3 - 1.2 mg/dL   GFR, Estimated >60 >60 mL/min    Comment: (NOTE) Calculated using the CKD-EPI Creatinine Equation (2021)    Anion gap 10 5 - 15    Comment: Performed at [facility], [place] [address], [place], [place] [address]  CBC with Differential/Platelet     Status: Abnormal   Collection Time: 08/22/20  4:23 AM  Result Value Ref Range   WBC 5.8 4.0 - 10.5 K/uL   RBC 3.46 (L) 3.87 - 5.11 MIL/uL   Hemoglobin 12.0 12.0 - 15.0 g/dL   HCT 33.7 (L) 36 - 46 %   MCV 97.4 80.0 - 100.0 fL   MCH 34.7 (H) 26.0 - 34.0 pg   MCHC 35.6 30.0 - 36.0 g/dL   RDW 16.1 (H) 11.5 - 15.5 %   Platelets 127 (L) 150 - 400 K/uL   nRBC 0.0 0.0 - 0.2 %   Neutrophils Relative % 85 %   Neutro Abs 5.0 1.7 - 7.7 K/uL   Lymphocytes Relative 7 %   Lymphs Abs 0.4 (L) 0.7 - 4.0 K/uL   Monocytes Relative 5 %   Monocytes Absolute 0.3 0.1 - 1.0 K/uL   Eosinophils Relative 1 %   Eosinophils Absolute 0.0 0.0 - 0.5 K/uL   Basophils Relative 1 %   Basophils Absolute 0.1 0.0 - 0.1 K/uL   Immature Granulocytes 1 %   Abs Immature Granulocytes 0.04 0.00 - 0.07 K/uL    Comment: Performed at [facility], [place] [address], [place], [place] [address]  Phosphorus     Status: None   Collection Time: 08/22/20  4:23 AM  Result Value Ref  Range   Phosphorus 3.5 2.5 - 4.6 mg/dL    Comment: Performed at [facility] [address], [place], [place] [address]  Magnesium     Status: None   Collection Time: 08/22/20  4:23 AM  Result Value Ref Range   Magnesium 1.8 1.7 - 2.4 mg/dL    Comment: Performed at [facility] [address] [place],  [address]    CT ABDOMEN PELVIS W CONTRAST  Result Date: 08/21/2020 CLINICAL DATA:  Abdominal pain, cirrhosis with generalized abdominal pain, concern for SBP EXAM: CT ABDOMEN AND PELVIS WITH CONTRAST TECHNIQUE: Multidetector CT imaging of the abdomen and pelvis was performed using the standard protocol following bolus administration of intravenous contrast. CONTRAST:  151mL OMNIPAQUE IOHEXOL 300 MG/ML  SOLN COMPARISON:  None. FINDINGS: Lower chest: Atelectatic changes are present in the lung bases. Normal heart size. No pericardial effusion. Hepatobiliary: Diffusely heterogeneous and nodular liver compatible with provided history of cirrhosis. Intermediate attenuation (70 HU) lesion is seen in the anterior segment 4 (3/15) measuring 1.8 by 1.8 cm in size, indeterminate on this single phase CT examination. Geographic regions of more focal hyperattenuation seen in the periphery of the right lobe and left lobe liver likely related to altered perfusion dynamics. These may result in some  artifactual conspicuity particularly in the anterior left lobe (3/11) versus developing regenerative nodules. No intrahepatic biliary dilatation. Gallbladder is fairly distended with multiple layering gallstones and few prominent gallbladder folds. No gallbladder wall thickening is seen however. No biliary ductal dilatation or intraductal gallstones are seen. Pancreas: No pancreatic ductal dilatation or surrounding inflammatory changes. Spleen: Splenomegaly.  No concerning focal splenic lesion. Adrenals/Urinary Tract: Normal adrenal glands. Kidneys are normally located with symmetric enhancement. No  suspicious renal lesion, urolithiasis or hydronephrosis. The urinary bladder is unremarkable for the degree of distention Stomach/Bowel: Distal esophagus, stomach and duodenal sweep are unremarkable. No small bowel wall thickening or dilatation. No evidence of obstruction. A normal appendix is visualized. No colonic dilatation or wall thickening. Vascular/Lymphatic: Extensive upper abdominal venous collateralization including reconstitution tortuosity of the umbilical vein, caput medusa a of the anterior abdominal wall, right slightly more pronounced than left, and additional collateralization involving the SMV and renal vein circulation. Mild narrowing of the intrahepatic IVC likely related to cirrhotic changes. Hepatic veins are poorly opacified. Abdominal aorta is normal caliber. No acute luminal abnormality. Reproductive: Anteverted uterus.  No concerning adnexal lesions. Other: Moderate to large volume ascites. No discernible rim enhancing loculation or collection is seen. No free air. No bowel containing hernias small fat containing umbilical hernia. Vascular collaterals as above. Musculoskeletal: Multilevel degenerative changes are present in the imaged portions of the spine. No acute osseous abnormality or suspicious osseous lesion. IMPRESSION: 1. Diffusely heterogeneous and nodular liver compatible with provided history of cirrhosis. Geographic regions of hyperattenuation seen in the periphery of the right lobe and left lobe liver likely related to altered perfusion dynamics. These may result in some artifactual conspicuity particularly in the anterior left lobe liver (3/11) versus developing regenerative nodules. An additional indeterminate 1.8 cm nodule is seen in the anterior segment 4. Consider further evaluation with nonemergent liver MRI. 2. Additional stigmata of cirrhosis including upper abdominal venous collateralization including reconstitution, tortuosity of the umbilical vein, caput medusa, and  additional collateralization involving the SMV and right renal vein circulation. Mild narrowing of the intrahepatic IVC likely related to cirrhotic changes. 3. Splenomegaly. 4. Moderate to large volume ascites. No discernible rim enhancing loculation or collection is seen though spontaneous bacterial peritonitis cannot be fully excluded on an imaging basis. Consider fluid sampling. 5. Cholelithiasis with gallbladder distention. No gallbladder wall thickening or biliary dilatation is seen. If there is persisting concern for acute cholecystitis however, consider ultrasound or HIDA. Electronically Signed   By: Lovena Le M.D.   On: 08/21/2020 17:01   DG Chest Portable 1 View  Result Date: 08/21/2020 CLINICAL DATA:  Shortness of breath. EXAM: PORTABLE CHEST 1 VIEW COMPARISON:  No pertinent prior exams available for comparison. FINDINGS: Heart size within normal limits. No appreciable airspace consolidation or pulmonary edema. No evidence of pleural effusion or pneumothorax. Elevation of the right hemidiaphragm. No evidence of acute bony abnormality. IMPRESSION: No evidence of acute cardiopulmonary abnormality. Elevation of the right hemidiaphragm, nonspecific but possibly reflecting underlying hepatomegaly. Electronically Signed   By: Kellie Simmering DO   On: 08/21/2020 15:27    Review of Systems negative except above Blood pressure 109/74, pulse 97, temperature 98.9 F (37.2 C), temperature source Oral, resp. rate 18, height 5\' 7"  (1.702 m), weight 65.3 kg, SpO2 93 %. Physical Exam no acute distress vital signs stable afebrile exam pertinent for mild ascites nontender no pedal edema paracentesis compatible with transudate CT reviewed  Assessment/Plan: Alcoholic hepatitis and cirrhosis Plan: I agree with the  plan as dictated in the H&P note and agree with prednisolone use possibly switching to prednisone for insurance reasons as an outpatient and she probably needs another treatment center stay if she has  any hopes of being a transplant candidate and agree with current dose of diuretics and at some point as an outpatient happy to repeat endoscopy to see if beta-blockers are needed can hold off for that for now unless signs of bleeding and will check on tomorrow and will need to warn about over-the-counter medicine use including aspirin nonsteroidals and no more than 4 Tylenol a day and do not think there is a role for morphine currently  Christobal Morado E 08/22/2020, 9:31 AM

## 2020-08-23 ENCOUNTER — Other Ambulatory Visit (HOSPITAL_COMMUNITY): Payer: Self-pay | Admitting: Family Medicine

## 2020-08-23 DIAGNOSIS — K7010 Alcoholic hepatitis without ascites: Secondary | ICD-10-CM

## 2020-08-23 DIAGNOSIS — K7031 Alcoholic cirrhosis of liver with ascites: Principal | ICD-10-CM

## 2020-08-23 LAB — CBC WITH DIFFERENTIAL/PLATELET
Abs Immature Granulocytes: 0.04 10*3/uL (ref 0.00–0.07)
Basophils Absolute: 0 10*3/uL (ref 0.0–0.1)
Basophils Relative: 0 %
Eosinophils Absolute: 0 10*3/uL (ref 0.0–0.5)
Eosinophils Relative: 0 %
HCT: 32.7 % — ABNORMAL LOW (ref 36.0–46.0)
Hemoglobin: 11.1 g/dL — ABNORMAL LOW (ref 12.0–15.0)
Immature Granulocytes: 1 %
Lymphocytes Relative: 8 %
Lymphs Abs: 0.6 10*3/uL — ABNORMAL LOW (ref 0.7–4.0)
MCH: 33.8 pg (ref 26.0–34.0)
MCHC: 33.9 g/dL (ref 30.0–36.0)
MCV: 99.7 fL (ref 80.0–100.0)
Monocytes Absolute: 0.6 10*3/uL (ref 0.1–1.0)
Monocytes Relative: 9 %
Neutro Abs: 5.8 10*3/uL (ref 1.7–7.7)
Neutrophils Relative %: 82 %
Platelets: 116 10*3/uL — ABNORMAL LOW (ref 150–400)
RBC: 3.28 MIL/uL — ABNORMAL LOW (ref 3.87–5.11)
RDW: 15.7 % — ABNORMAL HIGH (ref 11.5–15.5)
WBC: 7.1 10*3/uL (ref 4.0–10.5)
nRBC: 0 % (ref 0.0–0.2)

## 2020-08-23 LAB — COMPREHENSIVE METABOLIC PANEL
ALT: 23 U/L (ref 0–44)
AST: 70 U/L — ABNORMAL HIGH (ref 15–41)
Albumin: 2.4 g/dL — ABNORMAL LOW (ref 3.5–5.0)
Alkaline Phosphatase: 131 U/L — ABNORMAL HIGH (ref 38–126)
Anion gap: 10 (ref 5–15)
BUN: <5 mg/dL — ABNORMAL LOW (ref 6–20)
CO2: 25 mmol/L (ref 22–32)
Calcium: 8.6 mg/dL — ABNORMAL LOW (ref 8.9–10.3)
Chloride: 100 mmol/L (ref 98–111)
Creatinine, Ser: 0.57 mg/dL (ref 0.44–1.00)
GFR, Estimated: >60 mL/min (ref >60)
Glucose, Bld: 191 mg/dL — ABNORMAL HIGH (ref 70–99)
Potassium: 3.8 mmol/L (ref 3.5–5.1)
Sodium: 135 mmol/L (ref 135–145)
Total Bilirubin: 11 mg/dL — ABNORMAL HIGH (ref 0.3–1.2)
Total Protein: 6.1 g/dL — ABNORMAL LOW (ref 6.5–8.1)

## 2020-08-23 LAB — AFP TUMOR MARKER: AFP, Serum, Tumor Marker: 4 ng/mL (ref 0.0–8.3)

## 2020-08-23 LAB — PATHOLOGIST SMEAR REVIEW

## 2020-08-23 MED ORDER — SPIRONOLACTONE 100 MG PO TABS: 100.0000 mg | ORAL_TABLET | Freq: Every day | ORAL | 0 refills | Status: DC

## 2020-08-23 MED ORDER — PREDNISOLONE 15 MG/5ML PO SOLN: 32.0000 mg | Freq: Every day | ORAL | 0 refills | Status: AC

## 2020-08-23 MED ORDER — LACTULOSE 10 GM/15ML PO SOLN: 30.0000 g | Freq: Every day | ORAL | 0 refills | Status: AC

## 2020-08-23 MED ORDER — FUROSEMIDE 20 MG PO TABS: 20.0000 mg | ORAL_TABLET | Freq: Every day | ORAL | 0 refills | Status: DC

## 2020-08-23 MED FILL — FUROSEMIDE 20 MG TAB: 20 | 30 days supply | Qty: 30 | Fill #0

## 2020-08-23 MED FILL — LACTULOSE 10 GM/15 ML SOLN: 10 | 30 days supply | Qty: 1419 | Fill #0

## 2020-08-23 MED FILL — PREDNISOLONE 15 MG/5 ML SOL: 15 | 30 days supply | Qty: 321 | Fill #0

## 2020-08-23 MED FILL — SPIRONOLACTONE 100 MG TABS: 100 | 30 days supply | Qty: 30 | Fill #0

## 2020-08-23 NOTE — Progress Notes (Signed)
Discharge instructions reviewed with patient, all questions answered. Prescriptions delivered to bedside from pharmacy. Patient escorted to entrance for discharge.

## 2020-08-23 NOTE — Progress Notes (Signed)
Jill Farmer 10:14 AM  Subjective: Patient doing well after her paracentesis and no longer has abdominal pain and we discussed her diet including low-salt and we discussed minimizing over-the-counter medicines and vitamins and no alcohol and discussed her chronic back pain answered all of her questions  Objective: Vital signs stable afebrile no acute distress abdomen is soft nontender minimal ascites bili improved other labs stable repeat tap still transudate  Assessment: Alcoholic cirrhosis  Plan: Okay with me to go home soon and please call if we can be of any further assistance with this hospital stay and can follow-up with me in 1 month to probably set up elective endoscopy at that point as well as repeat labs if needed and adjust diuretic  [facility]  office [phone] After 5PM or if no answer call [phone]

## 2020-08-23 NOTE — Discharge Summary (Signed)
Physician Discharge Summary  Jill Farmer FIE:[id number] DOB: May 09, 1981 DOA: 08/21/2020  PCP: Patient, No Pcp Per  Admit date: 08/21/2020 Discharge date: 08/23/2020  Admitted From: Home Disposition: Home  Recommendations for Outpatient Follow-up:  1. Follow up with PCP in 1-2 weeks 2. Follow-up with GI within 2 to 4 weeks 3. Please obtain BMP/CBC in one week 4. Please follow up with your PCP on the following pending results: Unresulted Labs (From admission, onward)          Start     Ordered   08/23/20 0500  CBC with Differential/Platelet  Daily,   R     Question:  Specimen collection method  Answer:  Lab=Lab collect   08/22/20 1309   08/23/20 0500  Comprehensive metabolic panel  Daily,   R     Question:  Specimen collection method  Answer:  Lab=Lab collect   08/22/20 1309   08/22/20 1220  Pathologist smear review  Once,   TIMED        08/22/20 1220   08/21/20 1916  Urine rapid drug screen (hosp performed)  ONCE - STAT,   STAT        08/21/20 1915           Home Health: None Equipment/Devices: None  Discharge Condition: Stable CODE STATUS: Full code Diet recommendation: Low-sodium  Subjective: Seen and examined.  Feels much better.  Ready to go home.  ZYS:AYTKZSW Jill Farmer a 39 y.o.femalewith medical history significant ofalcoholic cirrhosis diagnosed in 2017,bilateral knee bursitis, presented with worsening of jaundice and abdominal pain. Patient was diagnosed alcoholic cirrhosis in [place] in 2015, she has been following with her hepatologist [phone] in [place]. She was once on the transplant list. But patient reported that she was not prescribed with diuresis medications.Since 2019, patient lost her follow-ups and in 2020, she moved to [place] area. Patient claimed that she has been restrainedfrom alcohol use since 2019,but for the last 3 to 4 months she started to pick up the binge drinking again, drinks vodka 2-3 times a week and last drink was 2  weeks ago.  5 days ago she started to feel sharp like abdominal pain, generalized, associated with nausea, she tried to take Zofran occasionally with minimum help. She denied any diarrhea and she described her stool ashard. Denied any fever or chills. Meantime, herabdomen becomes more distended. ED Course:Diagnostic paracentesis was done, results pending. Lab work showed significant bilirubinemia but no leukocytosis.  Brief/Interim Summary: Patient was admitted under hospitalist service due to ascites.  She underwent diagnostic paracentesis in the ED which was negative for SBP.  GI was consulted.  She was also started on Lasix and Aldactone.  Antibiotics were also continued.  She then underwent another diagnostic as well as therapeutic paracentesis with removal of 3.5 L.  This fluid analysis was once again negative for any SBP so antibiotics were discontinued per GI recommendations.  Patient was also started on prednisolone for alcoholic hepatitis.  She was cleared by GI to discharge and they recommended continuing prednisolone and diuretics.  They have been prescribed to her.  She is being discharged in stable condition.  She did not have any withdrawal symptoms from alcohol.  She endorsed that her last alcohol drink was at least 2 weeks prior to presenting to the ED.  She understands that further alcohol drinks can be very detrimental for her.  She seems to be motivated to quit.  She has been provided with extensive counseling regarding that.  She understands  that she needs to follow-up with GI within a month for elective endoscopy and further treatment of her cirrhosis and alcoholic hepatitis.  Discharge Diagnoses:  Active Problems:   Alcoholic hepatitis with ascites   Hepatitis, alcoholic, acute   Alcoholic cirrhosis of liver with ascites [facility])    Discharge Instructions   Allergies as of 08/23/2020      Reactions   Nsaids Anaphylaxis   Sulfa Antibiotics Other (See Comments)   Unknown       Medication List    STOP taking these medications   MILK THISTLE PO   potassium chloride 10 MEQ tablet Commonly known as: KLOR-CON     TAKE these medications   furosemide 20 MG tablet Commonly known as: LASIX Take 1 tablet (20 mg total) by mouth daily. Start taking on: August 24, 2020   lactulose 10 GM/15ML solution Commonly known as: CHRONULAC Take 45 mLs (30 g total) by mouth daily. Start taking on: August 24, 2020   multivitamin with minerals Tabs tablet Take 1 tablet by mouth daily.   ondansetron 4 MG disintegrating tablet Commonly known as: ZOFRAN-ODT Take 4 mg by mouth every 8 (eight) hours as needed for nausea or vomiting.   prednisoLONE 15 MG/5ML Soln Commonly known as: PRELONE Take 10.7 mLs (32 mg total) by mouth daily before breakfast.   spironolactone 100 MG tablet Commonly known as: ALDACTONE Take 1 tablet (100 mg total) by mouth daily. Start taking on: August 24, 2020       Allergies  Allergen Reactions  . Nsaids Anaphylaxis  . Sulfa Antibiotics Other (See Comments)    Unknown    Consultations: GI and IR   Procedures/Studies: CT ABDOMEN PELVIS W CONTRAST  Result Date: 08/21/2020 CLINICAL DATA:  Abdominal pain, cirrhosis with generalized abdominal pain, concern for SBP EXAM: CT ABDOMEN AND PELVIS WITH CONTRAST TECHNIQUE: Multidetector CT imaging of the abdomen and pelvis was performed using the standard protocol following bolus administration of intravenous contrast. CONTRAST:  197mL OMNIPAQUE IOHEXOL 300 MG/ML  SOLN COMPARISON:  None. FINDINGS: Lower chest: Atelectatic changes are present in the lung bases. Normal heart size. No pericardial effusion. Hepatobiliary: Diffusely heterogeneous and nodular liver compatible with provided history of cirrhosis. Intermediate attenuation (70 HU) lesion is seen in the anterior segment 4 (3/15) measuring 1.8 by 1.8 cm in size, indeterminate on this single phase CT examination. Geographic regions of more  focal hyperattenuation seen in the periphery of the right lobe and left lobe liver likely related to altered perfusion dynamics. These may result in some artifactual conspicuity particularly in the anterior left lobe (3/11) versus developing regenerative nodules. No intrahepatic biliary dilatation. Gallbladder is fairly distended with multiple layering gallstones and few prominent gallbladder folds. No gallbladder wall thickening is seen however. No biliary ductal dilatation or intraductal gallstones are seen. Pancreas: No pancreatic ductal dilatation or surrounding inflammatory changes. Spleen: Splenomegaly.  No concerning focal splenic lesion. Adrenals/Urinary Tract: Normal adrenal glands. Kidneys are normally located with symmetric enhancement. No suspicious renal lesion, urolithiasis or hydronephrosis. The urinary bladder is unremarkable for the degree of distention Stomach/Bowel: Distal esophagus, stomach and duodenal sweep are unremarkable. No small bowel wall thickening or dilatation. No evidence of obstruction. A normal appendix is visualized. No colonic dilatation or wall thickening. Vascular/Lymphatic: Extensive upper abdominal venous collateralization including reconstitution tortuosity of the umbilical vein, caput medusa a of the anterior abdominal wall, right slightly more pronounced than left, and additional collateralization involving the SMV and renal vein circulation. Mild narrowing of the intrahepatic  IVC likely related to cirrhotic changes. Hepatic veins are poorly opacified. Abdominal aorta is normal caliber. No acute luminal abnormality. Reproductive: Anteverted uterus.  No concerning adnexal lesions. Other: Moderate to large volume ascites. No discernible rim enhancing loculation or collection is seen. No free air. No bowel containing hernias small fat containing umbilical hernia. Vascular collaterals as above. Musculoskeletal: Multilevel degenerative changes are present in the imaged portions  of the spine. No acute osseous abnormality or suspicious osseous lesion. IMPRESSION: 1. Diffusely heterogeneous and nodular liver compatible with provided history of cirrhosis. Geographic regions of hyperattenuation seen in the periphery of the right lobe and left lobe liver likely related to altered perfusion dynamics. These may result in some artifactual conspicuity particularly in the anterior left lobe liver (3/11) versus developing regenerative nodules. An additional indeterminate 1.8 cm nodule is seen in the anterior segment 4. Consider further evaluation with nonemergent liver MRI. 2. Additional stigmata of cirrhosis including upper abdominal venous collateralization including reconstitution, tortuosity of the umbilical vein, caput medusa, and additional collateralization involving the SMV and right renal vein circulation. Mild narrowing of the intrahepatic IVC likely related to cirrhotic changes. 3. Splenomegaly. 4. Moderate to large volume ascites. No discernible rim enhancing loculation or collection is seen though spontaneous bacterial peritonitis cannot be fully excluded on an imaging basis. Consider fluid sampling. 5. Cholelithiasis with gallbladder distention. No gallbladder wall thickening or biliary dilatation is seen. If there is persisting concern for acute cholecystitis however, consider ultrasound or HIDA. Electronically Signed   By: Lovena Le Jill.D.   On: 08/21/2020 17:01   DG Chest Portable 1 View  Result Date: 08/21/2020 CLINICAL DATA:  Shortness of breath. EXAM: PORTABLE CHEST 1 VIEW COMPARISON:  No pertinent prior exams available for comparison. FINDINGS: Heart size within normal limits. No appreciable airspace consolidation or pulmonary edema. No evidence of pleural effusion or pneumothorax. Elevation of the right hemidiaphragm. No evidence of acute bony abnormality. IMPRESSION: No evidence of acute cardiopulmonary abnormality. Elevation of the right hemidiaphragm, nonspecific but  possibly reflecting underlying hepatomegaly. Electronically Signed   By: Kellie Simmering DO   On: 08/21/2020 15:27   CT Maxillofacial Wo Contrast  Result Date: 07/24/2020 CLINICAL DATA:  Facial trauma last night. EXAM: CT MAXILLOFACIAL WITHOUT CONTRAST TECHNIQUE: Multidetector CT imaging of the maxillofacial structures was performed. Multiplanar CT image reconstructions were also generated. COMPARISON:  03/31/2020 FINDINGS: Osseous: No acute facial fracture. Nasal fractures which were acute in July of this year have healed. No evidence of additional bone injury. Nasal septum bows prominently towards the left. Orbits: Normal Sinuses: Clear.  No inflammatory change or layering blood. Soft tissues: Otherwise negative Limited intracranial: No acute finding. IMPRESSION: No acute traumatic finding. Nasal fractures which were acute in July of this year have healed. Electronically Signed   By: Nelson Chimes Jill.D.   On: 07/24/2020 12:23   IR Paracentesis  Result Date: 08/22/2020 INDICATION: Patient with a history of cirrhosis and new onset ascites presents today for therapeutic and diagnostic paracentesis. EXAM: ULTRASOUND GUIDED PARACENTESIS MEDICATIONS: 1% lidocaine 20 mL COMPLICATIONS: None immediate. PROCEDURE: Informed written consent was obtained from the patient after a discussion of the risks, benefits and alternatives to treatment. A timeout was performed prior to the initiation of the procedure. Initial ultrasound scanning demonstrates a large amount of ascites within the right lower abdominal quadrant. The right lower abdomen was prepped and draped in the usual sterile fashion. 1% lidocaine was used for local anesthesia. Following this, a 19 gauge, 7-cm, Yueh catheter  was introduced. An ultrasound image was saved for documentation purposes. The paracentesis was performed. The catheter was removed and a dressing was applied. The patient tolerated the procedure well without immediate post procedural complication.  FINDINGS: A total of approximately 3.95 L of dark yellow fluid was removed. Samples were sent to the laboratory as requested by the clinical team. IMPRESSION: Successful ultrasound-guided paracentesis yielding 3.95 liters of peritoneal fluid. Read by: Soyla Dryer, NP Electronically Signed   By: Sandi Mariscal Jill.D.   On: 08/22/2020 13:51      Discharge Exam: Vitals:   08/23/20 0513 08/23/20 0759  BP: 107/71 99/67  Pulse: 87 80  Resp: 18 14  Temp: 98.4 F (36.9 C) 98.4 F (36.9 C)  SpO2: 94% 96%   Vitals:   08/22/20 1715 08/22/20 2111 08/23/20 0513 08/23/20 0759  BP: 115/68 109/67 107/71 99/67  Pulse: 95 96 87 80  Resp: 18 19 18 14   Temp: 98.6 F (37 C) 98.8 F (37.1 C) 98.4 F (36.9 C) 98.4 F (36.9 C)  TempSrc:    Oral  SpO2: 96% 92% 94% 96%  Weight:  65.3 kg    Height:        General: Pt is alert, awake, not in acute distress Cardiovascular: RRR, S1/S2 +, no rubs, no gallops Respiratory: CTA bilaterally, no wheezing, no rhonchi Abdominal: Soft, NT, very mildly distended due to very small amount of ascites, bowel sounds + Extremities: no edema, no cyanosis    The results of significant diagnostics from this hospitalization (including imaging, microbiology, ancillary and laboratory) are listed below for reference.     Microbiology: Recent Results (from the past 240 hour(s))  Resp Panel by RT-PCR (Flu A&B, Covid) Nasopharyngeal Swab     Status: None   Collection Time: 08/21/20  3:15 PM   Specimen: Nasopharyngeal Swab; Nasopharyngeal(NP) swabs in vial transport medium  Result Value Ref Range Status   SARS Coronavirus 2 by RT PCR NEGATIVE NEGATIVE Final    Comment: (NOTE) SARS-CoV-2 target nucleic acids are NOT DETECTED.  The SARS-CoV-2 RNA is generally detectable in upper respiratory specimens during the acute phase of infection. The lowest concentration of SARS-CoV-2 viral copies this assay can detect is 138 copies/mL. A negative result does not preclude  SARS-Cov-2 infection and should not be used as the sole basis for treatment or other patient management decisions. A negative result may occur with  improper specimen collection/handling, submission of specimen other than nasopharyngeal swab, presence of viral mutation(s) within the areas targeted by this assay, and inadequate number of viral copies(<138 copies/mL). A negative result must be combined with clinical observations, patient history, and epidemiological information. The expected result is Negative.  Fact Sheet for Patients:  EntrepreneurPulse.com.au  Fact Sheet for Healthcare Providers:  IncredibleEmployment.be  This test is no t yet approved or cleared by the [place] FDA and  has been authorized for detection and/or diagnosis of SARS-CoV-2 by FDA under an Emergency Use Authorization (EUA). This EUA will remain  in effect (meaning this test can be used) for the duration of the COVID-19 declaration under Section 564(b)(1) of the Act, 21 U.S.C.section 360bbb-3(b)(1), unless the authorization is terminated  or revoked sooner.       Influenza A by PCR NEGATIVE NEGATIVE Final   Influenza B by PCR NEGATIVE NEGATIVE Final    Comment: (NOTE) The Xpert Xpress SARS-CoV-2/FLU/RSV plus assay is intended as an aid in the diagnosis of influenza from Nasopharyngeal swab specimens and should not be used as a  sole basis for treatment. Nasal washings and aspirates are unacceptable for Xpert Xpress SARS-CoV-2/FLU/RSV testing.  Fact Sheet for Patients: EntrepreneurPulse.com.au  Fact Sheet for Healthcare Providers: IncredibleEmployment.be  This test is not yet approved or cleared by the [place] FDA and has been authorized for detection and/or diagnosis of SARS-CoV-2 by FDA under an Emergency Use Authorization (EUA). This EUA will remain in effect (meaning this test can be used) for the duration of  the COVID-19 declaration under Section 564(b)(1) of the Act, 21 U.S.C. section 360bbb-3(b)(1), unless the authorization is terminated or revoked.  Performed at [facility], [place] [address], [place], [place] [address]   Gram stain     Status: None   Collection Time: 08/21/20  7:05 PM   Specimen: Peritoneal Cavity; Body Fluid  Result Value Ref Range Status   Specimen Description PERITONEAL CAVITY  Final   Special Requests FLUID  Final   Gram Stain   Final    WBC PRESENT, PREDOMINANTLY MONONUCLEAR NO ORGANISMS SEEN CYTOSPIN SMEAR Performed at [facility], [address], [place], [place] [address]    Report Status 08/22/2020 FINAL  Final  Culture, body fluid-bottle     Status: None (Preliminary result)   Collection Time: 08/21/20  7:05 PM   Specimen: Peritoneal Cavity  Result Value Ref Range Status   Specimen Description PERITONEAL CAVITY  Final   Special Requests FLUID  Final   Culture   Final    NO GROWTH 2 DAYS Performed at [place] [address], [place], [place] [address]    Report Status PENDING  Incomplete  Gram stain     Status: None   Collection Time: 08/22/20  1:20 PM   Specimen: Abdomen; Peritoneal Fluid  Result Value Ref Range Status   Specimen Description FLUID PERITONEAL  Final   Special Requests NONE  Final   Gram Stain   Final    FEW WBC PRESENT, PREDOMINANTLY MONONUCLEAR NO ORGANISMS SEEN Performed at [facility], [address], [place], [place] [address]    Report Status 08/22/2020 FINAL  Final  Culture, body fluid-bottle     Status: None (Preliminary result)   Collection Time: 08/22/20  1:20 PM   Specimen: Fluid  Result Value Ref Range Status   Specimen Description FLUID PERITONEAL  Final   Special Requests BOTTLES DRAWN AEROBIC AND ANAEROBIC 10CC  Final   Culture   Final    NO GROWTH < 24 HOURS Performed at [facility], [place] [address] [place], [place] [address]    Report Status PENDING  Incomplete      Labs: BNP (last 3 results) No results for input(s): BNP in the last 8760 hours. Basic Metabolic Panel: Recent Labs  Lab 08/21/20 1215 08/22/20 0423 08/23/20 0243  NA 134* 130* 135  K 3.2* 4.1 3.8  CL 96* 98 100  CO2 25 22 25   GLUCOSE 113* 171* 191*  BUN <5* <5* <5*  CREATININE 0.64 0.60 0.57  CALCIUM 8.9 8.4* 8.6*  MG  --  1.8  --   PHOS  --  3.5  --    Liver Function Tests: Recent Labs  Lab 08/21/20 1215 08/22/20 0423 08/23/20 0243  AST 163* 113* 70*  ALT 38 27 23  ALKPHOS 140* 114 131*  BILITOT 19.3* 15.5* 11.0*  PROT 7.8 6.6 6.1*  ALBUMIN 2.4* 2.2* 2.4*   Recent Labs  Lab 08/21/20 1215  LIPASE 47   Recent Labs  Lab  08/21/20 1514  AMMONIA 63*   CBC: Recent Labs  Lab 08/21/20 1215 08/22/20 0423 08/23/20 0243  WBC 8.8 5.8 7.1  NEUTROABS 6.3 5.0 5.8  HGB 13.7 12.0 11.1*  HCT 40.3 33.7* 32.7*  MCV 100.5* 97.4 99.7  PLT 165 127* 116*   Cardiac Enzymes: No results for input(s): CKTOTAL, CKMB, CKMBINDEX, TROPONINI in the last 168 hours. BNP: Invalid input(s): POCBNP CBG: No results for input(s): GLUCAP in the last 168 hours. D-Dimer No results for input(s): DDIMER in the last 72 hours. Hgb A1c No results for input(s): HGBA1C in the last 72 hours. Lipid Profile No results for input(s): CHOL, HDL, LDLCALC, TRIG, CHOLHDL, LDLDIRECT in the last 72 hours. Thyroid function studies No results for input(s): TSH, T4TOTAL, T3FREE, THYROIDAB in the last 72 hours.  Invalid input(s): FREET3 Anemia work up No results for input(s): VITAMINB12, FOLATE, FERRITIN, TIBC, IRON, RETICCTPCT in the last 72 hours. Urinalysis    Component Value Date/Time   COLORURINE AMBER (A) 08/21/2020 1756   APPEARANCEUR CLEAR 08/21/2020 1756   LABSPEC >1.046 (H) 08/21/2020 1756   PHURINE 6.0 08/21/2020 1756   GLUCOSEU NEGATIVE 08/21/2020 1756   HGBUR NEGATIVE 08/21/2020 1756   BILIRUBINUR MODERATE (A) 08/21/2020 1756   KETONESUR NEGATIVE 08/21/2020 1756   PROTEINUR  NEGATIVE 08/21/2020 1756   NITRITE POSITIVE (A) 08/21/2020 1756   LEUKOCYTESUR NEGATIVE 08/21/2020 1756   Sepsis Labs Invalid input(s): PROCALCITONIN,  WBC,  LACTICIDVEN Microbiology Recent Results (from the past 240 hour(s))  Resp Panel by RT-PCR (Flu A&B, Covid) Nasopharyngeal Swab     Status: None   Collection Time: 08/21/20  3:15 PM   Specimen: Nasopharyngeal Swab; Nasopharyngeal(NP) swabs in vial transport medium  Result Value Ref Range Status   SARS Coronavirus 2 by RT PCR NEGATIVE NEGATIVE Final    Comment: (NOTE) SARS-CoV-2 target nucleic acids are NOT DETECTED.  The SARS-CoV-2 RNA is generally detectable in upper respiratory specimens during the acute phase of infection. The lowest concentration of SARS-CoV-2 viral copies this assay can detect is 138 copies/mL. A negative result does not preclude SARS-Cov-2 infection and should not be used as the sole basis for treatment or other patient management decisions. A negative result may occur with  improper specimen collection/handling, submission of specimen other than nasopharyngeal swab, presence of viral mutation(s) within the areas targeted by this assay, and inadequate number of viral copies(<138 copies/mL). A negative result must be combined with clinical observations, patient history, and epidemiological information. The expected result is Negative.  Fact Sheet for Patients:  EntrepreneurPulse.com.au  Fact Sheet for Healthcare Providers:  IncredibleEmployment.be  This test is no t yet approved or cleared by the [place] FDA and  has been authorized for detection and/or diagnosis of SARS-CoV-2 by FDA under an Emergency Use Authorization (EUA). This EUA will remain  in effect (meaning this test can be used) for the duration of the COVID-19 declaration under Section 564(b)(1) of the Act, 21 U.S.C.section 360bbb-3(b)(1), unless the authorization is terminated  or revoked sooner.        Influenza A by PCR NEGATIVE NEGATIVE Final   Influenza B by PCR NEGATIVE NEGATIVE Final    Comment: (NOTE) The Xpert Xpress SARS-CoV-2/FLU/RSV plus assay is intended as an aid in the diagnosis of influenza from Nasopharyngeal swab specimens and should not be used as a sole basis for treatment. Nasal washings and aspirates are unacceptable for Xpert Xpress SARS-CoV-2/FLU/RSV testing.  Fact Sheet for Patients: EntrepreneurPulse.com.au  Fact Sheet for Healthcare Providers: IncredibleEmployment.be  This  test is not yet approved or cleared by the [place] and has been authorized for detection and/or diagnosis of SARS-CoV-2 by FDA under an Emergency Use Authorization (EUA). This EUA will remain in effect (meaning this test can be used) for the duration of the COVID-19 declaration under Section 564(b)(1) of the Act, 21 U.S.C. section 360bbb-3(b)(1), unless the authorization is terminated or revoked.  Performed at [facility], [place] [address] [place], [place] [address]   Gram stain     Status: None   Collection Time: 08/21/20  7:05 PM   Specimen: Peritoneal Cavity; Body Fluid  Result Value Ref Range Status   Specimen Description PERITONEAL CAVITY  Final   Special Requests FLUID  Final   Gram Stain   Final    WBC PRESENT, PREDOMINANTLY MONONUCLEAR NO ORGANISMS SEEN CYTOSPIN SMEAR Performed at [facility], [address], [place], [place] [address]    Report Status 08/22/2020 FINAL  Final  Culture, body fluid-bottle     Status: None (Preliminary result)   Collection Time: 08/21/20  7:05 PM   Specimen: Peritoneal Cavity  Result Value Ref Range Status   Specimen Description PERITONEAL CAVITY  Final   Special Requests FLUID  Final   Culture   Final    NO GROWTH 2 DAYS Performed at [place] [place] [address] [place], [place] [address]    Report Status PENDING  Incomplete  Gram stain     Status: None    Collection Time: 08/22/20  1:20 PM   Specimen: Abdomen; Peritoneal Fluid  Result Value Ref Range Status   Specimen Description FLUID PERITONEAL  Final   Special Requests NONE  Final   Gram Stain   Final    FEW WBC PRESENT, PREDOMINANTLY MONONUCLEAR NO ORGANISMS SEEN Performed at [facility], [address], [place], [place] [address]    Report Status 08/22/2020 FINAL  Final  Culture, body fluid-bottle     Status: None (Preliminary result)   Collection Time: 08/22/20  1:20 PM   Specimen: Fluid  Result Value Ref Range Status   Specimen Description FLUID PERITONEAL  Final   Special Requests BOTTLES DRAWN AEROBIC AND ANAEROBIC 10CC  Final   Culture   Final    NO GROWTH < 24 HOURS Performed at [facility], [place] [address], [place], [place] [address]    Report Status PENDING  Incomplete     Time coordinating discharge: Over 30 minutes  SIGNED:   Darliss Cheney, MD  [facility] 08/23/2020, 10:57 AM  If 7PM-7AM, please contact night-coverage [email or web address]

## 2020-08-23 NOTE — Discharge Instructions (Signed)
Alcoholic Hepatitis  Alcoholic hepatitis is liver inflammation that is caused by drinking a lot of alcohol over a long period of time. This inflammation decreases the liver's ability to function normally. This condition requires you to stop drinking alcohol permanently to prevent further damage. What are the causes? Alcoholic hepatitis is caused by long-term (chronic) heavy alcohol use. The liver filters alcohol out of the bloodstream. When alcohol gets divided into small particles (broken down) in the liver, substances are produced that can damage liver cells. This causes destruction of liver cells and inflammation. What increases the risk? The following factors may make you more likely to develop this condition:  Regularly drinking large amounts of alcohol, especially in a short amount of time (binge drinking).  Drinking heavily for years.  Being female.  Being obese.  Having had a hepatitis infection in the past.  Having a liver problem that you were born with (genetic liver disease).  Having a lack (deficiency) of certain nutrients, such as folate or thiamine.  Having a parent or sibling who has alcoholic hepatitis. What are the signs or symptoms? Symptoms of this condition include:  Pain and swelling in the abdomen.  Loss of appetite.  Losing weight without trying.  Nausea and vomiting.  Diarrhea.  Fever.  Fatigue.  Yellowing of the skin and the whites of the eyes (jaundice).  Veins that you can see ("spider veins"), especially in the abdomen.  Bleeding easily, such as excessive bleeding from a minor cut.  Itching.  Trouble thinking clearly.  Memory problems.  Mood changes.  Confusion. How is this diagnosed? This condition may be diagnosed with:  A physical exam and a review of medical history.  Blood tests to check liver function.  Tests that create detailed images of the body. These may include: ? A liver ultrasound. ? CT scan. ? MRI.  A  liver biopsy. For this test, a small sample of liver tissue is removed and checked for signs of liver damage. How is this treated? The most important part of treatment is to stop drinking alcohol. If you are addicted to alcohol, your health care provider will help you make a plan to quit. This plan may involve:  Taking medicine to decrease unpleasant symptoms that are caused by stopping or decreasing alcohol use (withdrawal symptoms).  Entering a treatment program to help you stop drinking.  Joining a support group. Treatment for alcoholic hepatitis may also include:  Steroid medicines to reduce inflammation.  Nutritional therapy. Your health care provider or a diet and nutrition specialist (dietitian) may recommend: ? Eating a healthy diet. ? Eating specific foods that contain vitamins and minerals to help you maintain nutrient levels in your body. ? Taking vitamins and dietary supplements to make sure you maintain nutrient levels in your body.  Receiving a donated liver (liver transplant). This is only done in very severe cases, and only for people who have completely stopped drinking and can commit to never drinking alcohol again. Follow these instructions at home:   Do not drink alcohol. Follow your treatment plan, and work with your health care provider as needed.  Consider joining an alcohol support group. These groups can provide emotional support and guidance.  Take over-the-counter and prescription medicines only as told by your health care provider. These include vitamins and supplements.  Do not use medicines or eat foods that contain alcohol unless told by your health care provider.  Follow instructions from your health care provider or dietitian about nutritional therapy.  Keep all follow-up visits as told by your health care provider. This is important. Contact a health care provider if:  You have a fever.  You have a decreased appetite.  You have flu-like  symptoms such as fatigue, weakness, or muscle aches.  You have nausea or vomiting.  You bruise easily.  Your urine is very dark.  You develop new pain in your abdomen. Get help right away if:  You vomit blood.  You develop jaundice.  You have severely itchy skin.  Your legs swell.  Your abdomen suddenly swells.  You have stools that are black, tar-like, or bloody.  You bleed easily, such as excessive bleeding from a minor cut.  You are confused or not thinking clearly.  You have a seizure. Summary  Alcoholic hepatitis is liver inflammation that is caused by drinking a lot of alcohol over a long period of time.  Alcoholic hepatitis is diagnosed with blood tests that check liver function.  The most important part of treatment is to stop drinking alcohol. Follow your treatment plan, and work with your health care provider as needed. This information is not intended to replace advice given to you by your health care provider. Make sure you discuss any questions you have with your health care provider. Document Revised: 12/27/2018 Document Reviewed: 05/20/2017 Elsevier Patient Education  [place].

## 2020-08-23 NOTE — TOC Benefit Eligibility Note (Signed)
Transition of Care [facility]) Benefit Eligibility Note    Patient Details  Name: JEDA PARDUE MRN: [id number] Date of Birth: October 09, 1980   Medication/Dose: prednisolone 30mg   Covered?: No (may be able to obtain a coverage exception - phone [phone])  Spoke with Person/Company/Phone Number:: [employer] [phone]      [place] Phone Number: 08/23/2020, 7:38 AM

## 2020-08-23 NOTE — TOC Transition Note (Signed)
Transition of Care [facility]) - CM/SW Discharge Note   Patient Details  Name: Jill Farmer MRN: [id number] Date of Birth: Sep 30, 1980  Transition of Care [facility]) CM/SW Contact:  Bartholomew Crews, RN Phone Number: [phone] 08/23/2020, 12:24 PM   Clinical Narrative:     Spoke with patient at the bedside to discuss transition plans. Patient stated that she is home with her mom, [name]. Her mom will provide transportation home. Discussed recommended medication, prednisolone 32 mg daily. NCM spoke with representative at [facility] who advised that prednisolone liquid is on the formulary as tier 4. Prescription sent to [facility] pharmacy to fill and deliver to the room. PCP updated in Epic. Substance abuse resources provided - patient stated that she is also on the waiting list for [facility]. Advised that she can call [employer] for additional resources. No further TOC needs.   Final next level of care: Home/Self Care     Patient Goals and CMS Choice        Discharge Placement                       Discharge Plan and Services                                     Social Determinants of Health (SDOH) Interventions     Readmission Risk Interventions No flowsheet data found.

## 2020-08-24 LAB — PATHOLOGIST SMEAR REVIEW

## 2020-08-26 LAB — CULTURE, BODY FLUID W GRAM STAIN -BOTTLE: Culture: NO GROWTH

## 2020-08-27 LAB — CULTURE, BODY FLUID W GRAM STAIN -BOTTLE: Culture: NO GROWTH

## 2020-10-21 DIAGNOSIS — R110 Nausea: Secondary | ICD-10-CM | POA: Diagnosis not present

## 2020-10-21 DIAGNOSIS — K7030 Alcoholic cirrhosis of liver without ascites: Secondary | ICD-10-CM | POA: Diagnosis not present

## 2020-10-21 DIAGNOSIS — F1020 Alcohol dependence, uncomplicated: Secondary | ICD-10-CM | POA: Diagnosis not present

## 2020-10-21 DIAGNOSIS — K7010 Alcoholic hepatitis without ascites: Secondary | ICD-10-CM | POA: Diagnosis not present

## 2020-11-27 ENCOUNTER — Other Ambulatory Visit: Payer: Self-pay | Admitting: Nurse Practitioner

## 2020-11-27 DIAGNOSIS — R188 Other ascites: Secondary | ICD-10-CM | POA: Diagnosis not present

## 2020-11-27 DIAGNOSIS — D376 Neoplasm of uncertain behavior of liver, gallbladder and bile ducts: Secondary | ICD-10-CM | POA: Diagnosis not present

## 2020-11-27 DIAGNOSIS — K7030 Alcoholic cirrhosis of liver without ascites: Secondary | ICD-10-CM | POA: Diagnosis not present

## 2020-11-27 DIAGNOSIS — F1010 Alcohol abuse, uncomplicated: Secondary | ICD-10-CM | POA: Diagnosis not present

## 2020-12-21 ENCOUNTER — Other Ambulatory Visit: Payer: Self-pay

## 2020-12-21 ENCOUNTER — Ambulatory Visit
Admission: RE | Admit: 2020-12-21 | Discharge: 2020-12-21 | Disposition: A | Discharge status: Home / Self Care | Payer: BC Managed Care – PPO | Source: Ambulatory Visit | Attending: Nurse Practitioner | Admitting: Nurse Practitioner

## 2020-12-21 DIAGNOSIS — D376 Neoplasm of uncertain behavior of liver, gallbladder and bile ducts: Secondary | ICD-10-CM

## 2020-12-21 DIAGNOSIS — I864 Gastric varices: Secondary | ICD-10-CM | POA: Diagnosis not present

## 2020-12-21 DIAGNOSIS — K7469 Other cirrhosis of liver: Secondary | ICD-10-CM | POA: Diagnosis not present

## 2020-12-21 DIAGNOSIS — K7030 Alcoholic cirrhosis of liver without ascites: Secondary | ICD-10-CM

## 2020-12-21 DIAGNOSIS — K7031 Alcoholic cirrhosis of liver with ascites: Secondary | ICD-10-CM | POA: Diagnosis not present

## 2020-12-21 DIAGNOSIS — I8500 Esophageal varices without bleeding: Secondary | ICD-10-CM | POA: Diagnosis not present

## 2020-12-21 MED ORDER — GADOBENATE DIMEGLUMINE 529 MG/ML IV SOLN
12.0000 mL | Freq: Once | INTRAVENOUS | Status: AC | PRN
  Administered 2020-12-21: 12 mL via INTRAVENOUS

## 2021-02-03 DIAGNOSIS — R749 Abnormal serum enzyme level, unspecified: Secondary | ICD-10-CM | POA: Diagnosis not present

## 2021-02-03 DIAGNOSIS — K7031 Alcoholic cirrhosis of liver with ascites: Secondary | ICD-10-CM | POA: Diagnosis not present

## 2021-02-04 DIAGNOSIS — K7031 Alcoholic cirrhosis of liver with ascites: Secondary | ICD-10-CM | POA: Diagnosis not present

## 2021-02-04 DIAGNOSIS — F1020 Alcohol dependence, uncomplicated: Secondary | ICD-10-CM | POA: Diagnosis not present

## 2021-04-09 DIAGNOSIS — K7460 Unspecified cirrhosis of liver: Secondary | ICD-10-CM | POA: Diagnosis not present

## 2021-04-09 DIAGNOSIS — R040 Epistaxis: Secondary | ICD-10-CM | POA: Diagnosis not present

## 2021-04-09 DIAGNOSIS — K7010 Alcoholic hepatitis without ascites: Secondary | ICD-10-CM | POA: Diagnosis not present

## 2021-04-09 DIAGNOSIS — K8020 Calculus of gallbladder without cholecystitis without obstruction: Secondary | ICD-10-CM | POA: Diagnosis not present

## 2021-04-25 ENCOUNTER — Encounter (HOSPITAL_COMMUNITY): Payer: Self-pay

## 2021-04-25 ENCOUNTER — Emergency Department (HOSPITAL_COMMUNITY)
Admission: EM | Admit: 2021-04-25 | Discharge: 2021-04-25 | Disposition: A | Discharge status: Home / Self Care | Payer: BC Managed Care – PPO | Attending: Emergency Medicine | Admitting: Emergency Medicine

## 2021-04-25 ENCOUNTER — Other Ambulatory Visit: Payer: Self-pay

## 2021-04-25 ENCOUNTER — Emergency Department (HOSPITAL_COMMUNITY): Payer: BC Managed Care – PPO

## 2021-04-25 DIAGNOSIS — S0003XA Contusion of scalp, initial encounter: Secondary | ICD-10-CM | POA: Diagnosis not present

## 2021-04-25 DIAGNOSIS — S01111A Laceration without foreign body of right eyelid and periocular area, initial encounter: Secondary | ICD-10-CM | POA: Diagnosis not present

## 2021-04-25 DIAGNOSIS — R519 Headache, unspecified: Secondary | ICD-10-CM | POA: Diagnosis not present

## 2021-04-25 DIAGNOSIS — W01198A Fall on same level from slipping, tripping and stumbling with subsequent striking against other object, initial encounter: Secondary | ICD-10-CM | POA: Diagnosis not present

## 2021-04-25 DIAGNOSIS — W19XXXA Unspecified fall, initial encounter: Secondary | ICD-10-CM

## 2021-04-25 DIAGNOSIS — R9431 Abnormal electrocardiogram [ECG] [EKG]: Secondary | ICD-10-CM | POA: Diagnosis not present

## 2021-04-25 DIAGNOSIS — S0990XA Unspecified injury of head, initial encounter: Secondary | ICD-10-CM | POA: Diagnosis not present

## 2021-04-25 LAB — CBC WITH DIFFERENTIAL/PLATELET
Abs Immature Granulocytes: 0.02 10*3/uL (ref 0.00–0.07)
Basophils Absolute: 0 10*3/uL (ref 0.0–0.1)
Basophils Relative: 1 %
Eosinophils Absolute: 0 10*3/uL (ref 0.0–0.5)
Eosinophils Relative: 1 %
HCT: 33 % — ABNORMAL LOW (ref 36.0–46.0)
Hemoglobin: 11.4 g/dL — ABNORMAL LOW (ref 12.0–15.0)
Immature Granulocytes: 1 %
Lymphocytes Relative: 25 %
Lymphs Abs: 0.9 10*3/uL (ref 0.7–4.0)
MCH: 29.8 pg (ref 26.0–34.0)
MCHC: 34.5 g/dL (ref 30.0–36.0)
MCV: 86.2 fL (ref 80.0–100.0)
Monocytes Absolute: 0.5 10*3/uL (ref 0.1–1.0)
Monocytes Relative: 13 %
Neutro Abs: 2.1 10*3/uL (ref 1.7–7.7)
Neutrophils Relative %: 59 %
Platelets: 42 10*3/uL — ABNORMAL LOW (ref 150–400)
RBC: 3.83 MIL/uL — ABNORMAL LOW (ref 3.87–5.11)
RDW: 17.3 % — ABNORMAL HIGH (ref 11.5–15.5)
WBC: 3.4 10*3/uL — ABNORMAL LOW (ref 4.0–10.5)
nRBC: 0 % (ref 0.0–0.2)

## 2021-04-25 LAB — URINALYSIS, ROUTINE W REFLEX MICROSCOPIC
Bilirubin Urine: NEGATIVE
Glucose, UA: NEGATIVE mg/dL
Hgb urine dipstick: NEGATIVE
Ketones, ur: 20 mg/dL — AB
Nitrite: NEGATIVE
Protein, ur: 30 mg/dL — AB
Specific Gravity, Urine: 1.026 (ref 1.005–1.030)
pH: 5 (ref 5.0–8.0)

## 2021-04-25 LAB — COMPREHENSIVE METABOLIC PANEL
ALT: 45 U/L — ABNORMAL HIGH (ref 0–44)
AST: 118 U/L — ABNORMAL HIGH (ref 15–41)
Albumin: 3.3 g/dL — ABNORMAL LOW (ref 3.5–5.0)
Alkaline Phosphatase: 102 U/L (ref 38–126)
Anion gap: 11 (ref 5–15)
BUN: 6 mg/dL (ref 6–20)
CO2: 19 mmol/L — ABNORMAL LOW (ref 22–32)
Calcium: 8.2 mg/dL — ABNORMAL LOW (ref 8.9–10.3)
Chloride: 102 mmol/L (ref 98–111)
Creatinine, Ser: 0.39 mg/dL — ABNORMAL LOW (ref 0.44–1.00)
GFR, Estimated: >60 mL/min (ref >60)
Glucose, Bld: 132 mg/dL — ABNORMAL HIGH (ref 70–99)
Potassium: 3.5 mmol/L (ref 3.5–5.1)
Sodium: 132 mmol/L — ABNORMAL LOW (ref 135–145)
Total Bilirubin: 4.9 mg/dL — ABNORMAL HIGH (ref 0.3–1.2)
Total Protein: 7.2 g/dL (ref 6.5–8.1)

## 2021-04-25 MED ORDER — CHLORDIAZEPOXIDE HCL 25 MG PO CAPS: ORAL_CAPSULE | ORAL | 0 refills | Status: AC

## 2021-04-25 MED ORDER — CHLORDIAZEPOXIDE HCL 25 MG PO CAPS
100.0000 mg | ORAL_CAPSULE | Freq: Once | ORAL | Status: AC
  Administered 2021-04-25: 100 mg via ORAL
  Filled 2021-04-25: qty 4
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
## 2021-04-25 NOTE — Discharge Instructions (Addendum)
Follow up with your doctor in the office.  
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
## 2021-05-20 DIAGNOSIS — K7031 Alcoholic cirrhosis of liver with ascites: Secondary | ICD-10-CM | POA: Diagnosis not present

## 2021-05-20 DIAGNOSIS — Z7289 Other problems related to lifestyle: Secondary | ICD-10-CM | POA: Diagnosis not present

## 2021-05-21 ENCOUNTER — Other Ambulatory Visit: Payer: Self-pay | Admitting: Gastroenterology

## 2021-06-10 DIAGNOSIS — R0789 Other chest pain: Secondary | ICD-10-CM | POA: Diagnosis not present

## 2021-06-10 DIAGNOSIS — S40012A Contusion of left shoulder, initial encounter: Secondary | ICD-10-CM | POA: Diagnosis not present

## 2021-06-10 DIAGNOSIS — S299XXA Unspecified injury of thorax, initial encounter: Secondary | ICD-10-CM | POA: Diagnosis not present

## 2021-06-10 DIAGNOSIS — S42031D Displaced fracture of lateral end of right clavicle, subsequent encounter for fracture with routine healing: Secondary | ICD-10-CM | POA: Diagnosis not present

## 2021-06-10 DIAGNOSIS — X58XXXA Exposure to other specified factors, initial encounter: Secondary | ICD-10-CM | POA: Diagnosis not present

## 2021-06-19 DIAGNOSIS — R780 Finding of alcohol in blood: Secondary | ICD-10-CM | POA: Diagnosis not present

## 2021-06-19 DIAGNOSIS — E876 Hypokalemia: Secondary | ICD-10-CM | POA: Diagnosis not present

## 2021-06-19 DIAGNOSIS — Z882 Allergy status to sulfonamides status: Secondary | ICD-10-CM | POA: Diagnosis not present

## 2021-06-19 DIAGNOSIS — F10130 Alcohol abuse with withdrawal, uncomplicated: Secondary | ICD-10-CM | POA: Diagnosis not present

## 2021-06-19 DIAGNOSIS — Z886 Allergy status to analgesic agent status: Secondary | ICD-10-CM | POA: Diagnosis not present

## 2021-06-19 DIAGNOSIS — S4991XA Unspecified injury of right shoulder and upper arm, initial encounter: Secondary | ICD-10-CM | POA: Diagnosis not present

## 2021-06-19 DIAGNOSIS — S42031A Displaced fracture of lateral end of right clavicle, initial encounter for closed fracture: Secondary | ICD-10-CM | POA: Diagnosis not present

## 2021-06-19 DIAGNOSIS — K7010 Alcoholic hepatitis without ascites: Secondary | ICD-10-CM | POA: Diagnosis not present

## 2021-06-19 DIAGNOSIS — Z87890 Personal history of sex reassignment: Secondary | ICD-10-CM | POA: Diagnosis not present

## 2021-06-19 DIAGNOSIS — F10120 Alcohol abuse with intoxication, uncomplicated: Secondary | ICD-10-CM | POA: Diagnosis not present

## 2021-06-19 DIAGNOSIS — R569 Unspecified convulsions: Secondary | ICD-10-CM | POA: Diagnosis not present

## 2021-06-19 DIAGNOSIS — Z79899 Other long term (current) drug therapy: Secondary | ICD-10-CM | POA: Diagnosis not present

## 2021-06-19 DIAGNOSIS — W010XXA Fall on same level from slipping, tripping and stumbling without subsequent striking against object, initial encounter: Secondary | ICD-10-CM | POA: Diagnosis not present

## 2021-06-19 DIAGNOSIS — Y908 Blood alcohol level of 240 mg/100 ml or more: Secondary | ICD-10-CM | POA: Diagnosis not present

## 2021-06-20 DIAGNOSIS — Y908 Blood alcohol level of 240 mg/100 ml or more: Secondary | ICD-10-CM | POA: Diagnosis not present

## 2021-06-20 DIAGNOSIS — E876 Hypokalemia: Secondary | ICD-10-CM | POA: Diagnosis not present

## 2021-06-20 DIAGNOSIS — S42031A Displaced fracture of lateral end of right clavicle, initial encounter for closed fracture: Secondary | ICD-10-CM | POA: Diagnosis not present

## 2021-06-20 DIAGNOSIS — S4991XA Unspecified injury of right shoulder and upper arm, initial encounter: Secondary | ICD-10-CM | POA: Diagnosis not present

## 2021-06-20 DIAGNOSIS — F10120 Alcohol abuse with intoxication, uncomplicated: Secondary | ICD-10-CM | POA: Diagnosis not present

## 2021-06-21 DIAGNOSIS — R7401 Elevation of levels of liver transaminase levels: Secondary | ICD-10-CM | POA: Diagnosis not present

## 2021-06-21 DIAGNOSIS — Z882 Allergy status to sulfonamides status: Secondary | ICD-10-CM | POA: Diagnosis not present

## 2021-06-21 DIAGNOSIS — Z9181 History of falling: Secondary | ICD-10-CM | POA: Diagnosis not present

## 2021-06-21 DIAGNOSIS — Z20822 Contact with and (suspected) exposure to covid-19: Secondary | ICD-10-CM | POA: Diagnosis not present

## 2021-06-21 DIAGNOSIS — Z792 Long term (current) use of antibiotics: Secondary | ICD-10-CM | POA: Diagnosis not present

## 2021-06-21 DIAGNOSIS — K7030 Alcoholic cirrhosis of liver without ascites: Secondary | ICD-10-CM | POA: Diagnosis not present

## 2021-06-21 DIAGNOSIS — G4700 Insomnia, unspecified: Secondary | ICD-10-CM | POA: Diagnosis not present

## 2021-06-21 DIAGNOSIS — D696 Thrombocytopenia, unspecified: Secondary | ICD-10-CM | POA: Diagnosis not present

## 2021-06-21 DIAGNOSIS — F1020 Alcohol dependence, uncomplicated: Secondary | ICD-10-CM | POA: Diagnosis not present

## 2021-06-21 DIAGNOSIS — Z886 Allergy status to analgesic agent status: Secondary | ICD-10-CM | POA: Diagnosis not present

## 2021-06-21 DIAGNOSIS — N3001 Acute cystitis with hematuria: Secondary | ICD-10-CM | POA: Diagnosis not present

## 2021-06-21 DIAGNOSIS — R21 Rash and other nonspecific skin eruption: Secondary | ICD-10-CM | POA: Diagnosis not present

## 2021-06-21 DIAGNOSIS — Z87891 Personal history of nicotine dependence: Secondary | ICD-10-CM | POA: Diagnosis not present

## 2021-06-21 DIAGNOSIS — E876 Hypokalemia: Secondary | ICD-10-CM | POA: Diagnosis not present

## 2021-06-22 DIAGNOSIS — Z87891 Personal history of nicotine dependence: Secondary | ICD-10-CM | POA: Diagnosis not present

## 2021-06-22 DIAGNOSIS — Z882 Allergy status to sulfonamides status: Secondary | ICD-10-CM | POA: Diagnosis not present

## 2021-06-22 DIAGNOSIS — Z20822 Contact with and (suspected) exposure to covid-19: Secondary | ICD-10-CM | POA: Diagnosis not present

## 2021-06-22 DIAGNOSIS — N3001 Acute cystitis with hematuria: Secondary | ICD-10-CM | POA: Diagnosis not present

## 2021-06-22 DIAGNOSIS — E876 Hypokalemia: Secondary | ICD-10-CM | POA: Diagnosis not present

## 2021-06-22 DIAGNOSIS — R21 Rash and other nonspecific skin eruption: Secondary | ICD-10-CM | POA: Diagnosis not present

## 2021-06-22 DIAGNOSIS — G4700 Insomnia, unspecified: Secondary | ICD-10-CM | POA: Diagnosis not present

## 2021-06-22 DIAGNOSIS — F1020 Alcohol dependence, uncomplicated: Secondary | ICD-10-CM | POA: Diagnosis not present

## 2021-06-22 DIAGNOSIS — K7030 Alcoholic cirrhosis of liver without ascites: Secondary | ICD-10-CM | POA: Diagnosis not present

## 2021-06-22 DIAGNOSIS — R7401 Elevation of levels of liver transaminase levels: Secondary | ICD-10-CM | POA: Diagnosis not present

## 2021-06-22 DIAGNOSIS — Z9181 History of falling: Secondary | ICD-10-CM | POA: Diagnosis not present

## 2021-06-22 DIAGNOSIS — D696 Thrombocytopenia, unspecified: Secondary | ICD-10-CM | POA: Diagnosis not present

## 2021-06-22 DIAGNOSIS — Z886 Allergy status to analgesic agent status: Secondary | ICD-10-CM | POA: Diagnosis not present

## 2021-06-22 DIAGNOSIS — Z792 Long term (current) use of antibiotics: Secondary | ICD-10-CM | POA: Diagnosis not present

## 2021-06-23 DIAGNOSIS — R21 Rash and other nonspecific skin eruption: Secondary | ICD-10-CM | POA: Diagnosis not present

## 2021-06-23 DIAGNOSIS — Z886 Allergy status to analgesic agent status: Secondary | ICD-10-CM | POA: Diagnosis not present

## 2021-06-23 DIAGNOSIS — Z20822 Contact with and (suspected) exposure to covid-19: Secondary | ICD-10-CM | POA: Diagnosis not present

## 2021-06-23 DIAGNOSIS — F1020 Alcohol dependence, uncomplicated: Secondary | ICD-10-CM | POA: Diagnosis not present

## 2021-06-23 DIAGNOSIS — D696 Thrombocytopenia, unspecified: Secondary | ICD-10-CM | POA: Diagnosis not present

## 2021-06-23 DIAGNOSIS — G4700 Insomnia, unspecified: Secondary | ICD-10-CM | POA: Diagnosis not present

## 2021-06-23 DIAGNOSIS — Z882 Allergy status to sulfonamides status: Secondary | ICD-10-CM | POA: Diagnosis not present

## 2021-06-23 DIAGNOSIS — E876 Hypokalemia: Secondary | ICD-10-CM | POA: Diagnosis not present

## 2021-06-23 DIAGNOSIS — K7030 Alcoholic cirrhosis of liver without ascites: Secondary | ICD-10-CM | POA: Diagnosis not present

## 2021-06-23 DIAGNOSIS — Z792 Long term (current) use of antibiotics: Secondary | ICD-10-CM | POA: Diagnosis not present

## 2021-06-23 DIAGNOSIS — Z9181 History of falling: Secondary | ICD-10-CM | POA: Diagnosis not present

## 2021-06-23 DIAGNOSIS — Z87891 Personal history of nicotine dependence: Secondary | ICD-10-CM | POA: Diagnosis not present

## 2021-06-23 DIAGNOSIS — N3001 Acute cystitis with hematuria: Secondary | ICD-10-CM | POA: Diagnosis not present

## 2021-06-23 DIAGNOSIS — R7401 Elevation of levels of liver transaminase levels: Secondary | ICD-10-CM | POA: Diagnosis not present

## 2021-06-24 DIAGNOSIS — Z20822 Contact with and (suspected) exposure to covid-19: Secondary | ICD-10-CM | POA: Diagnosis not present

## 2021-06-24 DIAGNOSIS — D696 Thrombocytopenia, unspecified: Secondary | ICD-10-CM | POA: Diagnosis not present

## 2021-06-24 DIAGNOSIS — G4700 Insomnia, unspecified: Secondary | ICD-10-CM | POA: Diagnosis not present

## 2021-06-24 DIAGNOSIS — R21 Rash and other nonspecific skin eruption: Secondary | ICD-10-CM | POA: Diagnosis not present

## 2021-06-24 DIAGNOSIS — E876 Hypokalemia: Secondary | ICD-10-CM | POA: Diagnosis not present

## 2021-06-24 DIAGNOSIS — K7030 Alcoholic cirrhosis of liver without ascites: Secondary | ICD-10-CM | POA: Diagnosis not present

## 2021-06-24 DIAGNOSIS — Z882 Allergy status to sulfonamides status: Secondary | ICD-10-CM | POA: Diagnosis not present

## 2021-06-24 DIAGNOSIS — Z87891 Personal history of nicotine dependence: Secondary | ICD-10-CM | POA: Diagnosis not present

## 2021-06-24 DIAGNOSIS — R7401 Elevation of levels of liver transaminase levels: Secondary | ICD-10-CM | POA: Diagnosis not present

## 2021-06-24 DIAGNOSIS — Z792 Long term (current) use of antibiotics: Secondary | ICD-10-CM | POA: Diagnosis not present

## 2021-06-24 DIAGNOSIS — N3001 Acute cystitis with hematuria: Secondary | ICD-10-CM | POA: Diagnosis not present

## 2021-06-24 DIAGNOSIS — Z9181 History of falling: Secondary | ICD-10-CM | POA: Diagnosis not present

## 2021-06-24 DIAGNOSIS — Z886 Allergy status to analgesic agent status: Secondary | ICD-10-CM | POA: Diagnosis not present

## 2021-06-24 DIAGNOSIS — F1020 Alcohol dependence, uncomplicated: Secondary | ICD-10-CM | POA: Diagnosis not present

## 2021-06-25 DIAGNOSIS — Z9181 History of falling: Secondary | ICD-10-CM | POA: Diagnosis not present

## 2021-06-25 DIAGNOSIS — N3001 Acute cystitis with hematuria: Secondary | ICD-10-CM | POA: Diagnosis not present

## 2021-06-25 DIAGNOSIS — Z87891 Personal history of nicotine dependence: Secondary | ICD-10-CM | POA: Diagnosis not present

## 2021-06-25 DIAGNOSIS — Z792 Long term (current) use of antibiotics: Secondary | ICD-10-CM | POA: Diagnosis not present

## 2021-06-25 DIAGNOSIS — E876 Hypokalemia: Secondary | ICD-10-CM | POA: Diagnosis not present

## 2021-06-25 DIAGNOSIS — Z882 Allergy status to sulfonamides status: Secondary | ICD-10-CM | POA: Diagnosis not present

## 2021-06-25 DIAGNOSIS — Z886 Allergy status to analgesic agent status: Secondary | ICD-10-CM | POA: Diagnosis not present

## 2021-06-25 DIAGNOSIS — D696 Thrombocytopenia, unspecified: Secondary | ICD-10-CM | POA: Diagnosis not present

## 2021-06-25 DIAGNOSIS — F1020 Alcohol dependence, uncomplicated: Secondary | ICD-10-CM | POA: Diagnosis not present

## 2021-06-25 DIAGNOSIS — R21 Rash and other nonspecific skin eruption: Secondary | ICD-10-CM | POA: Diagnosis not present

## 2021-06-25 DIAGNOSIS — G4700 Insomnia, unspecified: Secondary | ICD-10-CM | POA: Diagnosis not present

## 2021-06-25 DIAGNOSIS — R7401 Elevation of levels of liver transaminase levels: Secondary | ICD-10-CM | POA: Diagnosis not present

## 2021-06-25 DIAGNOSIS — K7030 Alcoholic cirrhosis of liver without ascites: Secondary | ICD-10-CM | POA: Diagnosis not present

## 2021-06-25 DIAGNOSIS — Z20822 Contact with and (suspected) exposure to covid-19: Secondary | ICD-10-CM | POA: Diagnosis not present

## 2021-06-26 DIAGNOSIS — N3001 Acute cystitis with hematuria: Secondary | ICD-10-CM | POA: Diagnosis not present

## 2021-06-26 DIAGNOSIS — R7401 Elevation of levels of liver transaminase levels: Secondary | ICD-10-CM | POA: Diagnosis not present

## 2021-06-26 DIAGNOSIS — Z87891 Personal history of nicotine dependence: Secondary | ICD-10-CM | POA: Diagnosis not present

## 2021-06-26 DIAGNOSIS — D696 Thrombocytopenia, unspecified: Secondary | ICD-10-CM | POA: Diagnosis not present

## 2021-06-26 DIAGNOSIS — R21 Rash and other nonspecific skin eruption: Secondary | ICD-10-CM | POA: Diagnosis not present

## 2021-06-26 DIAGNOSIS — Z792 Long term (current) use of antibiotics: Secondary | ICD-10-CM | POA: Diagnosis not present

## 2021-06-26 DIAGNOSIS — Z886 Allergy status to analgesic agent status: Secondary | ICD-10-CM | POA: Diagnosis not present

## 2021-06-26 DIAGNOSIS — G4700 Insomnia, unspecified: Secondary | ICD-10-CM | POA: Diagnosis not present

## 2021-06-26 DIAGNOSIS — Z9181 History of falling: Secondary | ICD-10-CM | POA: Diagnosis not present

## 2021-06-26 DIAGNOSIS — F1020 Alcohol dependence, uncomplicated: Secondary | ICD-10-CM | POA: Diagnosis not present

## 2021-06-26 DIAGNOSIS — Z882 Allergy status to sulfonamides status: Secondary | ICD-10-CM | POA: Diagnosis not present

## 2021-06-26 DIAGNOSIS — Z20822 Contact with and (suspected) exposure to covid-19: Secondary | ICD-10-CM | POA: Diagnosis not present

## 2021-06-26 DIAGNOSIS — E876 Hypokalemia: Secondary | ICD-10-CM | POA: Diagnosis not present

## 2021-06-26 DIAGNOSIS — K7030 Alcoholic cirrhosis of liver without ascites: Secondary | ICD-10-CM | POA: Diagnosis not present

## 2021-06-27 DIAGNOSIS — Z792 Long term (current) use of antibiotics: Secondary | ICD-10-CM | POA: Diagnosis not present

## 2021-06-27 DIAGNOSIS — Z20822 Contact with and (suspected) exposure to covid-19: Secondary | ICD-10-CM | POA: Diagnosis not present

## 2021-06-27 DIAGNOSIS — Z87891 Personal history of nicotine dependence: Secondary | ICD-10-CM | POA: Diagnosis not present

## 2021-06-27 DIAGNOSIS — D696 Thrombocytopenia, unspecified: Secondary | ICD-10-CM | POA: Diagnosis not present

## 2021-06-27 DIAGNOSIS — R21 Rash and other nonspecific skin eruption: Secondary | ICD-10-CM | POA: Diagnosis not present

## 2021-06-27 DIAGNOSIS — K7030 Alcoholic cirrhosis of liver without ascites: Secondary | ICD-10-CM | POA: Diagnosis not present

## 2021-06-27 DIAGNOSIS — E876 Hypokalemia: Secondary | ICD-10-CM | POA: Diagnosis not present

## 2021-06-27 DIAGNOSIS — R7401 Elevation of levels of liver transaminase levels: Secondary | ICD-10-CM | POA: Diagnosis not present

## 2021-06-27 DIAGNOSIS — F1020 Alcohol dependence, uncomplicated: Secondary | ICD-10-CM | POA: Diagnosis not present

## 2021-06-27 DIAGNOSIS — N3001 Acute cystitis with hematuria: Secondary | ICD-10-CM | POA: Diagnosis not present

## 2021-06-27 DIAGNOSIS — Z9181 History of falling: Secondary | ICD-10-CM | POA: Diagnosis not present

## 2021-06-27 DIAGNOSIS — Z886 Allergy status to analgesic agent status: Secondary | ICD-10-CM | POA: Diagnosis not present

## 2021-06-27 DIAGNOSIS — Z882 Allergy status to sulfonamides status: Secondary | ICD-10-CM | POA: Diagnosis not present

## 2021-06-27 DIAGNOSIS — G4700 Insomnia, unspecified: Secondary | ICD-10-CM | POA: Diagnosis not present

## 2021-06-27 DIAGNOSIS — R17 Unspecified jaundice: Secondary | ICD-10-CM | POA: Diagnosis not present

## 2021-06-28 DIAGNOSIS — R21 Rash and other nonspecific skin eruption: Secondary | ICD-10-CM | POA: Diagnosis not present

## 2021-06-28 DIAGNOSIS — K7030 Alcoholic cirrhosis of liver without ascites: Secondary | ICD-10-CM | POA: Diagnosis not present

## 2021-06-28 DIAGNOSIS — Z87891 Personal history of nicotine dependence: Secondary | ICD-10-CM | POA: Diagnosis not present

## 2021-06-28 DIAGNOSIS — F1020 Alcohol dependence, uncomplicated: Secondary | ICD-10-CM | POA: Diagnosis not present

## 2021-06-28 DIAGNOSIS — Z882 Allergy status to sulfonamides status: Secondary | ICD-10-CM | POA: Diagnosis not present

## 2021-06-28 DIAGNOSIS — R7401 Elevation of levels of liver transaminase levels: Secondary | ICD-10-CM | POA: Diagnosis not present

## 2021-06-28 DIAGNOSIS — Z792 Long term (current) use of antibiotics: Secondary | ICD-10-CM | POA: Diagnosis not present

## 2021-06-28 DIAGNOSIS — Z9181 History of falling: Secondary | ICD-10-CM | POA: Diagnosis not present

## 2021-06-28 DIAGNOSIS — D696 Thrombocytopenia, unspecified: Secondary | ICD-10-CM | POA: Diagnosis not present

## 2021-06-28 DIAGNOSIS — Z20822 Contact with and (suspected) exposure to covid-19: Secondary | ICD-10-CM | POA: Diagnosis not present

## 2021-06-28 DIAGNOSIS — G4700 Insomnia, unspecified: Secondary | ICD-10-CM | POA: Diagnosis not present

## 2021-06-28 DIAGNOSIS — E876 Hypokalemia: Secondary | ICD-10-CM | POA: Diagnosis not present

## 2021-06-28 DIAGNOSIS — Z886 Allergy status to analgesic agent status: Secondary | ICD-10-CM | POA: Diagnosis not present

## 2021-06-28 DIAGNOSIS — N3001 Acute cystitis with hematuria: Secondary | ICD-10-CM | POA: Diagnosis not present

## 2021-06-29 DIAGNOSIS — G4700 Insomnia, unspecified: Secondary | ICD-10-CM | POA: Diagnosis not present

## 2021-06-29 DIAGNOSIS — Z9181 History of falling: Secondary | ICD-10-CM | POA: Diagnosis not present

## 2021-06-29 DIAGNOSIS — R7401 Elevation of levels of liver transaminase levels: Secondary | ICD-10-CM | POA: Diagnosis not present

## 2021-06-29 DIAGNOSIS — K7030 Alcoholic cirrhosis of liver without ascites: Secondary | ICD-10-CM | POA: Diagnosis not present

## 2021-06-29 DIAGNOSIS — F1020 Alcohol dependence, uncomplicated: Secondary | ICD-10-CM | POA: Diagnosis not present

## 2021-06-29 DIAGNOSIS — E876 Hypokalemia: Secondary | ICD-10-CM | POA: Diagnosis not present

## 2021-06-29 DIAGNOSIS — D696 Thrombocytopenia, unspecified: Secondary | ICD-10-CM | POA: Diagnosis not present

## 2021-06-29 DIAGNOSIS — Z20822 Contact with and (suspected) exposure to covid-19: Secondary | ICD-10-CM | POA: Diagnosis not present

## 2021-06-29 DIAGNOSIS — Z87891 Personal history of nicotine dependence: Secondary | ICD-10-CM | POA: Diagnosis not present

## 2021-06-29 DIAGNOSIS — Z792 Long term (current) use of antibiotics: Secondary | ICD-10-CM | POA: Diagnosis not present

## 2021-06-29 DIAGNOSIS — N3001 Acute cystitis with hematuria: Secondary | ICD-10-CM | POA: Diagnosis not present

## 2021-06-29 DIAGNOSIS — Z882 Allergy status to sulfonamides status: Secondary | ICD-10-CM | POA: Diagnosis not present

## 2021-06-29 DIAGNOSIS — Z886 Allergy status to analgesic agent status: Secondary | ICD-10-CM | POA: Diagnosis not present

## 2021-06-29 DIAGNOSIS — R21 Rash and other nonspecific skin eruption: Secondary | ICD-10-CM | POA: Diagnosis not present

## 2021-06-30 ENCOUNTER — Encounter (HOSPITAL_COMMUNITY): Payer: Self-pay | Admitting: Gastroenterology

## 2021-06-30 DIAGNOSIS — R21 Rash and other nonspecific skin eruption: Secondary | ICD-10-CM | POA: Diagnosis not present

## 2021-06-30 DIAGNOSIS — E876 Hypokalemia: Secondary | ICD-10-CM | POA: Diagnosis not present

## 2021-06-30 DIAGNOSIS — Z20822 Contact with and (suspected) exposure to covid-19: Secondary | ICD-10-CM | POA: Diagnosis not present

## 2021-06-30 DIAGNOSIS — G4700 Insomnia, unspecified: Secondary | ICD-10-CM | POA: Diagnosis not present

## 2021-06-30 DIAGNOSIS — N3001 Acute cystitis with hematuria: Secondary | ICD-10-CM | POA: Diagnosis not present

## 2021-06-30 DIAGNOSIS — E878 Other disorders of electrolyte and fluid balance, not elsewhere classified: Secondary | ICD-10-CM | POA: Diagnosis not present

## 2021-06-30 DIAGNOSIS — Z886 Allergy status to analgesic agent status: Secondary | ICD-10-CM | POA: Diagnosis not present

## 2021-06-30 DIAGNOSIS — R7401 Elevation of levels of liver transaminase levels: Secondary | ICD-10-CM | POA: Diagnosis not present

## 2021-06-30 DIAGNOSIS — D696 Thrombocytopenia, unspecified: Secondary | ICD-10-CM | POA: Diagnosis not present

## 2021-06-30 DIAGNOSIS — K7030 Alcoholic cirrhosis of liver without ascites: Secondary | ICD-10-CM | POA: Diagnosis not present

## 2021-06-30 DIAGNOSIS — Z792 Long term (current) use of antibiotics: Secondary | ICD-10-CM | POA: Diagnosis not present

## 2021-06-30 DIAGNOSIS — Z882 Allergy status to sulfonamides status: Secondary | ICD-10-CM | POA: Diagnosis not present

## 2021-06-30 DIAGNOSIS — Z9181 History of falling: Secondary | ICD-10-CM | POA: Diagnosis not present

## 2021-06-30 DIAGNOSIS — F1020 Alcohol dependence, uncomplicated: Secondary | ICD-10-CM | POA: Diagnosis not present

## 2021-06-30 DIAGNOSIS — Z87891 Personal history of nicotine dependence: Secondary | ICD-10-CM | POA: Diagnosis not present

## 2021-06-30 NOTE — Progress Notes (Signed)
Attempted to obtain medical history via telephone, unable to reach at this time. Unable to leave voicemail to return pre surgical testing department's phone call,due to mailbox full.  

## 2021-07-02 DIAGNOSIS — M7989 Other specified soft tissue disorders: Secondary | ICD-10-CM | POA: Diagnosis not present

## 2021-07-02 DIAGNOSIS — R600 Localized edema: Secondary | ICD-10-CM | POA: Diagnosis not present

## 2021-07-02 DIAGNOSIS — G479 Sleep disorder, unspecified: Secondary | ICD-10-CM | POA: Diagnosis not present

## 2021-07-02 DIAGNOSIS — Z79899 Other long term (current) drug therapy: Secondary | ICD-10-CM | POA: Diagnosis not present

## 2021-07-02 DIAGNOSIS — Z882 Allergy status to sulfonamides status: Secondary | ICD-10-CM | POA: Diagnosis not present

## 2021-07-02 DIAGNOSIS — G40909 Epilepsy, unspecified, not intractable, without status epilepticus: Secondary | ICD-10-CM | POA: Diagnosis not present

## 2021-07-02 DIAGNOSIS — Z886 Allergy status to analgesic agent status: Secondary | ICD-10-CM | POA: Diagnosis not present

## 2021-07-09 ENCOUNTER — Ambulatory Visit (HOSPITAL_COMMUNITY)
Admission: RE | Admit: 2021-07-09 | Payer: BC Managed Care – PPO | Source: Home / Self Care | Admitting: Gastroenterology

## 2021-07-09 ENCOUNTER — Encounter (HOSPITAL_COMMUNITY): Admission: RE | Payer: Self-pay | Source: Home / Self Care

## 2021-07-09 SURGERY — ESOPHAGOGASTRODUODENOSCOPY (EGD) WITH PROPOFOL
Anesthesia: Monitor Anesthesia Care

## 2021-07-28 DIAGNOSIS — F1020 Alcohol dependence, uncomplicated: Secondary | ICD-10-CM | POA: Diagnosis not present

## 2021-07-29 DIAGNOSIS — E7220 Disorder of urea cycle metabolism, unspecified: Secondary | ICD-10-CM | POA: Diagnosis not present

## 2021-07-29 DIAGNOSIS — R402363 Coma scale, best motor response, obeys commands, at hospital admission: Secondary | ICD-10-CM | POA: Diagnosis not present

## 2021-07-29 DIAGNOSIS — Y92008 Other place in unspecified non-institutional (private) residence as the place of occurrence of the external cause: Secondary | ICD-10-CM | POA: Diagnosis not present

## 2021-07-29 DIAGNOSIS — K8020 Calculus of gallbladder without cholecystitis without obstruction: Secondary | ICD-10-CM | POA: Diagnosis not present

## 2021-07-29 DIAGNOSIS — F1010 Alcohol abuse, uncomplicated: Secondary | ICD-10-CM | POA: Diagnosis not present

## 2021-07-29 DIAGNOSIS — K7040 Alcoholic hepatic failure without coma: Secondary | ICD-10-CM | POA: Diagnosis not present

## 2021-07-29 DIAGNOSIS — R5383 Other fatigue: Secondary | ICD-10-CM | POA: Diagnosis not present

## 2021-07-29 DIAGNOSIS — F419 Anxiety disorder, unspecified: Secondary | ICD-10-CM | POA: Diagnosis not present

## 2021-07-29 DIAGNOSIS — Z20822 Contact with and (suspected) exposure to covid-19: Secondary | ICD-10-CM | POA: Diagnosis not present

## 2021-07-29 DIAGNOSIS — S06360A Traumatic hemorrhage of cerebrum, unspecified, without loss of consciousness, initial encounter: Secondary | ICD-10-CM | POA: Diagnosis not present

## 2021-07-29 DIAGNOSIS — F1020 Alcohol dependence, uncomplicated: Secondary | ICD-10-CM | POA: Diagnosis not present

## 2021-07-29 DIAGNOSIS — W19XXXA Unspecified fall, initial encounter: Secondary | ICD-10-CM | POA: Diagnosis not present

## 2021-07-29 DIAGNOSIS — W010XXA Fall on same level from slipping, tripping and stumbling without subsequent striking against object, initial encounter: Secondary | ICD-10-CM | POA: Diagnosis not present

## 2021-07-29 DIAGNOSIS — S065X0A Traumatic subdural hemorrhage without loss of consciousness, initial encounter: Secondary | ICD-10-CM | POA: Diagnosis not present

## 2021-07-29 DIAGNOSIS — R402253 Coma scale, best verbal response, oriented, at hospital admission: Secondary | ICD-10-CM | POA: Diagnosis not present

## 2021-07-29 DIAGNOSIS — Y900 Blood alcohol level of less than 20 mg/100 ml: Secondary | ICD-10-CM | POA: Diagnosis not present

## 2021-07-29 DIAGNOSIS — Z743 Need for continuous supervision: Secondary | ICD-10-CM | POA: Diagnosis not present

## 2021-07-29 DIAGNOSIS — F10129 Alcohol abuse with intoxication, unspecified: Secondary | ICD-10-CM | POA: Diagnosis not present

## 2021-07-29 DIAGNOSIS — K7030 Alcoholic cirrhosis of liver without ascites: Secondary | ICD-10-CM | POA: Diagnosis not present

## 2021-07-29 DIAGNOSIS — S065X9A Traumatic subdural hemorrhage with loss of consciousness of unspecified duration, initial encounter: Secondary | ICD-10-CM | POA: Diagnosis not present

## 2021-07-29 DIAGNOSIS — S065XAA Traumatic subdural hemorrhage with loss of consciousness status unknown, initial encounter: Secondary | ICD-10-CM | POA: Diagnosis not present

## 2021-07-29 DIAGNOSIS — K766 Portal hypertension: Secondary | ICD-10-CM | POA: Diagnosis not present

## 2021-07-29 DIAGNOSIS — F1999 Other psychoactive substance use, unspecified with unspecified psychoactive substance-induced disorder: Secondary | ICD-10-CM | POA: Diagnosis not present

## 2021-07-29 DIAGNOSIS — S0990XA Unspecified injury of head, initial encounter: Secondary | ICD-10-CM | POA: Diagnosis not present

## 2021-07-29 DIAGNOSIS — R402143 Coma scale, eyes open, spontaneous, at hospital admission: Secondary | ICD-10-CM | POA: Diagnosis not present

## 2021-07-29 DIAGNOSIS — Z79899 Other long term (current) drug therapy: Secondary | ICD-10-CM | POA: Diagnosis not present

## 2021-07-31 DIAGNOSIS — Z79899 Other long term (current) drug therapy: Secondary | ICD-10-CM | POA: Diagnosis not present

## 2021-07-31 DIAGNOSIS — F1020 Alcohol dependence, uncomplicated: Secondary | ICD-10-CM | POA: Diagnosis not present

## 2021-07-31 DIAGNOSIS — W19XXXA Unspecified fall, initial encounter: Secondary | ICD-10-CM | POA: Diagnosis not present

## 2021-07-31 DIAGNOSIS — Z20822 Contact with and (suspected) exposure to covid-19: Secondary | ICD-10-CM | POA: Diagnosis not present

## 2021-07-31 DIAGNOSIS — Y92008 Other place in unspecified non-institutional (private) residence as the place of occurrence of the external cause: Secondary | ICD-10-CM | POA: Diagnosis not present

## 2021-08-04 DIAGNOSIS — R9431 Abnormal electrocardiogram [ECG] [EKG]: Secondary | ICD-10-CM | POA: Diagnosis not present

## 2021-08-04 DIAGNOSIS — I959 Hypotension, unspecified: Secondary | ICD-10-CM | POA: Diagnosis not present

## 2021-08-04 DIAGNOSIS — S065XAA Traumatic subdural hemorrhage with loss of consciousness status unknown, initial encounter: Secondary | ICD-10-CM | POA: Diagnosis not present

## 2021-08-04 DIAGNOSIS — Z8249 Family history of ischemic heart disease and other diseases of the circulatory system: Secondary | ICD-10-CM | POA: Diagnosis not present

## 2021-08-04 DIAGNOSIS — R531 Weakness: Secondary | ICD-10-CM | POA: Diagnosis not present

## 2021-08-04 DIAGNOSIS — K7030 Alcoholic cirrhosis of liver without ascites: Secondary | ICD-10-CM | POA: Diagnosis not present

## 2021-08-04 DIAGNOSIS — K7460 Unspecified cirrhosis of liver: Secondary | ICD-10-CM | POA: Diagnosis not present

## 2021-08-05 DIAGNOSIS — K7460 Unspecified cirrhosis of liver: Secondary | ICD-10-CM | POA: Diagnosis not present

## 2021-08-05 DIAGNOSIS — R531 Weakness: Secondary | ICD-10-CM | POA: Diagnosis not present

## 2021-08-05 DIAGNOSIS — Z8249 Family history of ischemic heart disease and other diseases of the circulatory system: Secondary | ICD-10-CM | POA: Diagnosis not present

## 2021-08-05 DIAGNOSIS — K7030 Alcoholic cirrhosis of liver without ascites: Secondary | ICD-10-CM | POA: Diagnosis not present

## 2021-08-05 DIAGNOSIS — I959 Hypotension, unspecified: Secondary | ICD-10-CM | POA: Diagnosis not present

## 2021-09-27 ENCOUNTER — Inpatient Hospital Stay (HOSPITAL_COMMUNITY): Payer: BC Managed Care – PPO

## 2021-09-27 ENCOUNTER — Inpatient Hospital Stay (HOSPITAL_COMMUNITY): Payer: BC Managed Care – PPO | Admitting: Certified Registered"

## 2021-09-27 ENCOUNTER — Emergency Department (HOSPITAL_COMMUNITY): Payer: BC Managed Care – PPO

## 2021-09-27 ENCOUNTER — Inpatient Hospital Stay (HOSPITAL_COMMUNITY): Admission: EM | Disposition: E | Payer: Self-pay | Source: Home / Self Care

## 2021-09-27 ENCOUNTER — Inpatient Hospital Stay (HOSPITAL_COMMUNITY)
Admission: EM | Admit: 2021-09-27 | Discharge: 2021-10-22 | DRG: 025 | Disposition: E | Payer: BC Managed Care – PPO | Attending: Surgery | Admitting: Surgery

## 2021-09-27 DIAGNOSIS — K7200 Acute and subacute hepatic failure without coma: Secondary | ICD-10-CM | POA: Diagnosis not present

## 2021-09-27 DIAGNOSIS — S0003XA Contusion of scalp, initial encounter: Secondary | ICD-10-CM | POA: Diagnosis not present

## 2021-09-27 DIAGNOSIS — S0990XA Unspecified injury of head, initial encounter: Secondary | ICD-10-CM | POA: Diagnosis not present

## 2021-09-27 DIAGNOSIS — E8721 Acute metabolic acidosis: Secondary | ICD-10-CM | POA: Diagnosis not present

## 2021-09-27 DIAGNOSIS — S065X0A Traumatic subdural hemorrhage without loss of consciousness, initial encounter: Secondary | ICD-10-CM | POA: Diagnosis not present

## 2021-09-27 DIAGNOSIS — K7210 Chronic hepatic failure without coma: Secondary | ICD-10-CM | POA: Diagnosis not present

## 2021-09-27 DIAGNOSIS — T1490XA Injury, unspecified, initial encounter: Secondary | ICD-10-CM | POA: Diagnosis present

## 2021-09-27 DIAGNOSIS — F1010 Alcohol abuse, uncomplicated: Secondary | ICD-10-CM | POA: Diagnosis present

## 2021-09-27 DIAGNOSIS — M4602 Spinal enthesopathy, cervical region: Secondary | ICD-10-CM | POA: Diagnosis not present

## 2021-09-27 DIAGNOSIS — Z515 Encounter for palliative care: Secondary | ICD-10-CM | POA: Diagnosis not present

## 2021-09-27 DIAGNOSIS — W06XXXA Fall from bed, initial encounter: Secondary | ICD-10-CM | POA: Diagnosis present

## 2021-09-27 DIAGNOSIS — R911 Solitary pulmonary nodule: Secondary | ICD-10-CM | POA: Diagnosis not present

## 2021-09-27 DIAGNOSIS — R161 Splenomegaly, not elsewhere classified: Secondary | ICD-10-CM | POA: Diagnosis present

## 2021-09-27 DIAGNOSIS — Y92009 Unspecified place in unspecified non-institutional (private) residence as the place of occurrence of the external cause: Secondary | ICD-10-CM | POA: Diagnosis not present

## 2021-09-27 DIAGNOSIS — S065X9A Traumatic subdural hemorrhage with loss of consciousness of unspecified duration, initial encounter: Principal | ICD-10-CM | POA: Diagnosis present

## 2021-09-27 DIAGNOSIS — E872 Acidosis, unspecified: Secondary | ICD-10-CM | POA: Diagnosis present

## 2021-09-27 DIAGNOSIS — K7031 Alcoholic cirrhosis of liver with ascites: Secondary | ICD-10-CM | POA: Diagnosis not present

## 2021-09-27 DIAGNOSIS — R34 Anuria and oliguria: Secondary | ICD-10-CM | POA: Diagnosis not present

## 2021-09-27 DIAGNOSIS — Z452 Encounter for adjustment and management of vascular access device: Secondary | ICD-10-CM

## 2021-09-27 DIAGNOSIS — Z043 Encounter for examination and observation following other accident: Secondary | ICD-10-CM | POA: Diagnosis not present

## 2021-09-27 DIAGNOSIS — Z66 Do not resuscitate: Secondary | ICD-10-CM | POA: Diagnosis not present

## 2021-09-27 DIAGNOSIS — R569 Unspecified convulsions: Secondary | ICD-10-CM | POA: Diagnosis not present

## 2021-09-27 DIAGNOSIS — Z886 Allergy status to analgesic agent status: Secondary | ICD-10-CM | POA: Diagnosis not present

## 2021-09-27 DIAGNOSIS — D696 Thrombocytopenia, unspecified: Secondary | ICD-10-CM | POA: Diagnosis present

## 2021-09-27 DIAGNOSIS — E161 Other hypoglycemia: Secondary | ICD-10-CM | POA: Diagnosis not present

## 2021-09-27 DIAGNOSIS — M5030 Other cervical disc degeneration, unspecified cervical region: Secondary | ICD-10-CM | POA: Diagnosis not present

## 2021-09-27 DIAGNOSIS — R402432 Glasgow coma scale score 3-8, at arrival to emergency department: Secondary | ICD-10-CM | POA: Diagnosis not present

## 2021-09-27 DIAGNOSIS — Z4682 Encounter for fitting and adjustment of non-vascular catheter: Secondary | ICD-10-CM | POA: Diagnosis not present

## 2021-09-27 DIAGNOSIS — D689 Coagulation defect, unspecified: Secondary | ICD-10-CM | POA: Diagnosis present

## 2021-09-27 DIAGNOSIS — K769 Liver disease, unspecified: Secondary | ICD-10-CM

## 2021-09-27 DIAGNOSIS — S065XAA Traumatic subdural hemorrhage with loss of consciousness status unknown, initial encounter: Secondary | ICD-10-CM | POA: Diagnosis not present

## 2021-09-27 DIAGNOSIS — K7290 Hepatic failure, unspecified without coma: Secondary | ICD-10-CM | POA: Diagnosis not present

## 2021-09-27 DIAGNOSIS — R4182 Altered mental status, unspecified: Secondary | ICD-10-CM | POA: Diagnosis not present

## 2021-09-27 DIAGNOSIS — I517 Cardiomegaly: Secondary | ICD-10-CM | POA: Diagnosis not present

## 2021-09-27 DIAGNOSIS — S3993XA Unspecified injury of pelvis, initial encounter: Secondary | ICD-10-CM | POA: Diagnosis not present

## 2021-09-27 DIAGNOSIS — R404 Transient alteration of awareness: Secondary | ICD-10-CM | POA: Diagnosis not present

## 2021-09-27 DIAGNOSIS — K7460 Unspecified cirrhosis of liver: Secondary | ICD-10-CM | POA: Diagnosis present

## 2021-09-27 DIAGNOSIS — D649 Anemia, unspecified: Secondary | ICD-10-CM | POA: Diagnosis not present

## 2021-09-27 DIAGNOSIS — Z20822 Contact with and (suspected) exposure to covid-19: Secondary | ICD-10-CM | POA: Diagnosis present

## 2021-09-27 DIAGNOSIS — S062X0A Diffuse traumatic brain injury without loss of consciousness, initial encounter: Secondary | ICD-10-CM | POA: Diagnosis not present

## 2021-09-27 DIAGNOSIS — I9589 Other hypotension: Secondary | ICD-10-CM | POA: Diagnosis not present

## 2021-09-27 DIAGNOSIS — I959 Hypotension, unspecified: Secondary | ICD-10-CM | POA: Diagnosis not present

## 2021-09-27 DIAGNOSIS — S3991XA Unspecified injury of abdomen, initial encounter: Secondary | ICD-10-CM | POA: Diagnosis not present

## 2021-09-27 DIAGNOSIS — E162 Hypoglycemia, unspecified: Secondary | ICD-10-CM | POA: Diagnosis not present

## 2021-09-27 HISTORY — PX: CRANIOTOMY: SHX93

## 2021-09-27 LAB — TRAUMA TEG PANEL
CFF Max Amplitude: 8.7 mm — ABNORMAL LOW (ref 15–32)
CFF Max Amplitude: <4 mm — ABNORMAL LOW (ref 15–32)
Citrated Kaolin (R): 6.7 min (ref 4.6–9.1)
Citrated Kaolin (R): 11.2 min — ABNORMAL HIGH (ref 4.6–9.1)
Citrated Rapid TEG (MA): <40 mm — ABNORMAL LOW (ref 52–70)
Citrated Rapid TEG (MA): <40 mm — ABNORMAL LOW (ref 52–70)
Lysis at 30 Minutes: 0 % (ref 0.0–2.6)

## 2021-09-27 LAB — POCT I-STAT 7, (LYTES, BLD GAS, ICA,H+H)
Acid-base deficit: 15 mmol/L — ABNORMAL HIGH (ref 0.0–2.0)
Acid-base deficit: 15 mmol/L — ABNORMAL HIGH (ref 0.0–2.0)
Bicarbonate: 11.3 mmol/L — ABNORMAL LOW (ref 20.0–28.0)
Bicarbonate: 11.3 mmol/L — ABNORMAL LOW (ref 20.0–28.0)
Calcium, Ion: 1.03 mmol/L — ABNORMAL LOW (ref 1.15–1.40)
Calcium, Ion: 0.99 mmol/L — ABNORMAL LOW (ref 1.15–1.40)
HCT: 18 % — ABNORMAL LOW (ref 36.0–46.0)
HCT: 16 % — ABNORMAL LOW (ref 36.0–46.0)
Hemoglobin: 6.1 g/dL — CL (ref 12.0–15.0)
Hemoglobin: 5.4 g/dL — CL (ref 12.0–15.0)
O2 Saturation: 100 %
O2 Saturation: 100 %
Patient temperature: 36
Patient temperature: 98.4
Potassium: 3.3 mmol/L — ABNORMAL LOW (ref 3.5–5.1)
Potassium: 3 mmol/L — ABNORMAL LOW (ref 3.5–5.1)
Sodium: 140 mmol/L (ref 135–145)
Sodium: 141 mmol/L (ref 135–145)
TCO2: 12 mmol/L — ABNORMAL LOW (ref 22–32)
TCO2: 12 mmol/L — ABNORMAL LOW (ref 22–32)
pCO2 arterial: 27.7 mmHg — ABNORMAL LOW (ref 32.0–48.0)
pCO2 arterial: 25.8 mmHg — ABNORMAL LOW (ref 32.0–48.0)
pH, Arterial: 7.214 — ABNORMAL LOW (ref 7.350–7.450)
pH, Arterial: 7.25 — ABNORMAL LOW (ref 7.350–7.450)
pO2, Arterial: 269 mmHg — ABNORMAL HIGH (ref 83.0–108.0)
pO2, Arterial: 373 mmHg — ABNORMAL HIGH (ref 83.0–108.0)

## 2021-09-27 LAB — RESP PANEL BY RT-PCR (FLU A&B, COVID) ARPGX2
Influenza A by PCR: NEGATIVE
Influenza B by PCR: NEGATIVE
SARS Coronavirus 2 by RT PCR: NEGATIVE

## 2021-09-27 LAB — URINALYSIS, ROUTINE W REFLEX MICROSCOPIC
Glucose, UA: NEGATIVE mg/dL
Ketones, ur: NEGATIVE mg/dL
Leukocytes,Ua: NEGATIVE
Nitrite: NEGATIVE
Protein, ur: 30 mg/dL — AB
Specific Gravity, Urine: >1.03 — ABNORMAL HIGH (ref 1.005–1.030)
pH: 6 (ref 5.0–8.0)

## 2021-09-27 LAB — COMPREHENSIVE METABOLIC PANEL
ALT: 35 U/L (ref 0–44)
AST: 130 U/L — ABNORMAL HIGH (ref 15–41)
Albumin: 2.3 g/dL — ABNORMAL LOW (ref 3.5–5.0)
Alkaline Phosphatase: 119 U/L (ref 38–126)
Anion gap: 26 — ABNORMAL HIGH (ref 5–15)
BUN: 9 mg/dL (ref 6–20)
CO2: 7 mmol/L — ABNORMAL LOW (ref 22–32)
Calcium: 7.8 mg/dL — ABNORMAL LOW (ref 8.9–10.3)
Chloride: 102 mmol/L (ref 98–111)
Creatinine, Ser: 1.22 mg/dL — ABNORMAL HIGH (ref 0.44–1.00)
GFR, Estimated: 58 mL/min — ABNORMAL LOW (ref >60)
Glucose, Bld: 110 mg/dL — ABNORMAL HIGH (ref 70–99)
Potassium: 3.5 mmol/L (ref 3.5–5.1)
Sodium: 135 mmol/L (ref 135–145)
Total Bilirubin: 9.2 mg/dL — ABNORMAL HIGH (ref 0.3–1.2)
Total Protein: 5.8 g/dL — ABNORMAL LOW (ref 6.5–8.1)

## 2021-09-27 LAB — I-STAT CHEM 8, ED
BUN: 9 mg/dL (ref 6–20)
Calcium, Ion: 0.98 mmol/L — ABNORMAL LOW (ref 1.15–1.40)
Chloride: 104 mmol/L (ref 98–111)
Creatinine, Ser: 0.9 mg/dL (ref 0.44–1.00)
Glucose, Bld: 105 mg/dL — ABNORMAL HIGH (ref 70–99)
HCT: 33 % — ABNORMAL LOW (ref 36.0–46.0)
Hemoglobin: 11.2 g/dL — ABNORMAL LOW (ref 12.0–15.0)
Potassium: 3.5 mmol/L (ref 3.5–5.1)
Sodium: 135 mmol/L (ref 135–145)
TCO2: 9 mmol/L — ABNORMAL LOW (ref 22–32)

## 2021-09-27 LAB — URINALYSIS, MICROSCOPIC (REFLEX): Bacteria, UA: NONE SEEN

## 2021-09-27 LAB — MAGNESIUM: Magnesium: 1.5 mg/dL — ABNORMAL LOW (ref 1.7–2.4)

## 2021-09-27 LAB — BASIC METABOLIC PANEL
Anion gap: 22 — ABNORMAL HIGH (ref 5–15)
BUN: 9 mg/dL (ref 6–20)
CO2: 10 mmol/L — ABNORMAL LOW (ref 22–32)
Calcium: 7.2 mg/dL — ABNORMAL LOW (ref 8.9–10.3)
Chloride: 107 mmol/L (ref 98–111)
Creatinine, Ser: 1.41 mg/dL — ABNORMAL HIGH (ref 0.44–1.00)
GFR, Estimated: 49 mL/min — ABNORMAL LOW (ref >60)
Glucose, Bld: 45 mg/dL — ABNORMAL LOW (ref 70–99)
Potassium: 2.9 mmol/L — ABNORMAL LOW (ref 3.5–5.1)
Sodium: 139 mmol/L (ref 135–145)

## 2021-09-27 LAB — CBC
HCT: 35.9 % — ABNORMAL LOW (ref 36.0–46.0)
Hemoglobin: 10.9 g/dL — ABNORMAL LOW (ref 12.0–15.0)
MCH: 31.9 pg (ref 26.0–34.0)
MCHC: 30.4 g/dL (ref 30.0–36.0)
MCV: 105 fL — ABNORMAL HIGH (ref 80.0–100.0)
Platelets: UNDETERMINED 10*3/uL (ref 150–400)
RBC: 3.42 MIL/uL — ABNORMAL LOW (ref 3.87–5.11)
RDW: 19 % — ABNORMAL HIGH (ref 11.5–15.5)
WBC: 10 10*3/uL (ref 4.0–10.5)
nRBC: 0.9 % — ABNORMAL HIGH (ref 0.0–0.2)

## 2021-09-27 LAB — SAMPLE TO BLOOD BANK

## 2021-09-27 LAB — PHOSPHORUS: Phosphorus: 4.6 mg/dL (ref 2.5–4.6)

## 2021-09-27 LAB — LACTIC ACID, PLASMA: Lactic Acid, Venous: >9 mmol/L (ref 0.5–1.9)

## 2021-09-27 LAB — CBG MONITORING, ED: Glucose-Capillary: 105 mg/dL — ABNORMAL HIGH (ref 70–99)

## 2021-09-27 LAB — PREPARE RBC (CROSSMATCH)

## 2021-09-27 LAB — HIV ANTIBODY (ROUTINE TESTING W REFLEX): HIV Screen 4th Generation wRfx: NONREACTIVE

## 2021-09-27 LAB — LIPASE, BLOOD: Lipase: 31 U/L (ref 11–51)

## 2021-09-27 LAB — ETHANOL: Alcohol, Ethyl (B): <10 mg/dL (ref <10)

## 2021-09-27 LAB — ABO/RH: ABO/RH(D): B NEG

## 2021-09-27 LAB — CK: Total CK: 906 U/L — ABNORMAL HIGH (ref 38–234)

## 2021-09-27 SURGERY — CRANIOTOMY HEMATOMA EVACUATION SUBDURAL
Anesthesia: General | Site: Head | Laterality: Right

## 2021-09-27 MED ORDER — ONDANSETRON 4 MG PO TBDP: 4.0000 mg | ORAL_TABLET | Freq: Four times a day (QID) | ORAL | Status: DC | PRN

## 2021-09-27 MED ORDER — VASOPRESSIN 20 UNIT/ML IV SOLN
INTRAVENOUS | Status: DC | PRN
  Administered 2021-09-27: .05 [IU]/min via INTRAVENOUS

## 2021-09-27 MED ORDER — ROCURONIUM BROMIDE 50 MG/5ML IV SOLN
INTRAVENOUS | Status: DC | PRN
  Administered 2021-09-27: 100 mg via INTRAVENOUS

## 2021-09-27 MED ORDER — DOCUSATE SODIUM 100 MG PO CAPS: 100.0000 mg | ORAL_CAPSULE | Freq: Two times a day (BID) | ORAL | Status: DC

## 2021-09-27 MED ORDER — SODIUM BICARBONATE 8.4 % IV SOLN
INTRAVENOUS | Status: DC | PRN
  Administered 2021-09-27 (×5): 50 meq via INTRAVENOUS

## 2021-09-27 MED ORDER — THIAMINE HCL 100 MG PO TABS: 100.0000 mg | ORAL_TABLET | Freq: Every day | ORAL | Status: DC

## 2021-09-27 MED ORDER — FOLIC ACID 1 MG PO TABS: 1.0000 mg | ORAL_TABLET | Freq: Every day | ORAL | Status: DC

## 2021-09-27 MED ORDER — VASOPRESSIN 20 UNITS/100 ML INFUSION FOR SHOCK
0.0000 [IU]/min | INTRAVENOUS | Status: DC
  Administered 2021-09-27 (×2): 0.03 [IU]/min via INTRAVENOUS
  Filled 2021-09-27 (×2): qty 100

## 2021-09-27 MED ORDER — VANCOMYCIN HCL 750 MG/150ML IV SOLN: 750.0000 mg | Freq: Two times a day (BID) | INTRAVENOUS | Status: DC

## 2021-09-27 MED ORDER — DEXMEDETOMIDINE HCL IN NACL 400 MCG/100ML IV SOLN
0.2000 ug/kg/h | INTRAVENOUS | Status: DC
  Administered 2021-09-27: 0.2 ug/kg/h via INTRAVENOUS
  Filled 2021-09-27: qty 100

## 2021-09-27 MED ORDER — PHENYLEPHRINE HCL (PRESSORS) 10 MG/ML IV SOLN
INTRAVENOUS | Status: DC | PRN
  Administered 2021-09-27 (×2): 120 ug via INTRAVENOUS
  Administered 2021-09-27 (×2): 80 ug via INTRAVENOUS

## 2021-09-27 MED ORDER — LORAZEPAM 2 MG/ML IJ SOLN
0.5000 mg/h | INTRAVENOUS | Status: DC
  Administered 2021-09-28: 1 mg/h via INTRAVENOUS
  Filled 2021-09-27: qty 25

## 2021-09-27 MED ORDER — PROMETHAZINE HCL 25 MG PO TABS: 12.5000 mg | ORAL_TABLET | ORAL | Status: DC | PRN

## 2021-09-27 MED ORDER — NOREPINEPHRINE 4 MG/250ML-% IV SOLN
INTRAVENOUS | Status: DC | PRN
  Administered 2021-09-27: 5 ug/min via INTRAVENOUS

## 2021-09-27 MED ORDER — SPIRITUS FRUMENTI
1.0000 | Freq: Three times a day (TID) | ORAL | Status: DC
  Administered 2021-09-27: 1
  Filled 2021-09-27 (×2): qty 1

## 2021-09-27 MED ORDER — EPINEPHRINE HCL 5 MG/250ML IV SOLN IN NS
0.5000 ug/min | INTRAVENOUS | Status: DC
  Administered 2021-09-27: 0.5 ug/min via INTRAVENOUS
  Administered 2021-09-28: 30 ug/min via INTRAVENOUS
  Filled 2021-09-27 (×3): qty 250

## 2021-09-27 MED ORDER — METHOCARBAMOL 500 MG PO TABS: 1000.0000 mg | ORAL_TABLET | Freq: Three times a day (TID) | ORAL | Status: DC

## 2021-09-27 MED ORDER — TRANEXAMIC ACID-NACL 1000-0.7 MG/100ML-% IV SOLN
1000.0000 mg | Freq: Once | INTRAVENOUS | Status: AC
  Administered 2021-09-27: 1000 mg via INTRAVENOUS

## 2021-09-27 MED ORDER — SODIUM CHLORIDE 0.9% IV SOLUTION: Freq: Once | INTRAVENOUS | Status: AC

## 2021-09-27 MED ORDER — PHENYLEPHRINE HCL-NACL 20-0.9 MG/250ML-% IV SOLN
INTRAVENOUS | Status: DC | PRN
  Administered 2021-09-27: 50 ug/min via INTRAVENOUS

## 2021-09-27 MED ORDER — VANCOMYCIN HCL 500 MG/100ML IV SOLN
500.0000 mg | Freq: Once | INTRAVENOUS | Status: DC
  Filled 2021-09-27: qty 100

## 2021-09-27 MED ORDER — LEVETIRACETAM IN NACL 500 MG/100ML IV SOLN
500.0000 mg | Freq: Two times a day (BID) | INTRAVENOUS | Status: DC
  Administered 2021-09-27: 500 mg via INTRAVENOUS
  Filled 2021-09-27 (×2): qty 100

## 2021-09-27 MED ORDER — SODIUM BICARBONATE 8.4 % IV SOLN
INTRAVENOUS | Status: AC
  Filled 2021-09-27: qty 50

## 2021-09-27 MED ORDER — VASOPRESSIN 20 UNIT/ML IV SOLN
INTRAVENOUS | Status: DC | PRN
  Administered 2021-09-27: 2 [IU] via INTRAVENOUS
  Administered 2021-09-27 (×2): 5 [IU] via INTRAVENOUS
  Administered 2021-09-27: 3 [IU] via INTRAVENOUS
  Administered 2021-09-27 (×3): 5 [IU] via INTRAVENOUS
  Administered 2021-09-27: 3 [IU] via INTRAVENOUS

## 2021-09-27 MED ORDER — SODIUM CHLORIDE 0.9 % IV SOLN
0.1500 ug/kg/min | Freq: Once | INTRAVENOUS | Status: AC
  Administered 2021-09-27: .05 ug/kg/min via INTRAVENOUS
  Filled 2021-09-27: qty 5000

## 2021-09-27 MED ORDER — BACITRACIN ZINC 500 UNIT/GM EX OINT
TOPICAL_OINTMENT | CUTANEOUS | Status: DC | PRN
Start: 2021-09-27 — End: 2021-09-27
  Administered 2021-09-27: 1 via TOPICAL

## 2021-09-27 MED ORDER — FENTANYL CITRATE (PF) 250 MCG/5ML IJ SOLN
INTRAMUSCULAR | Status: DC | PRN
Start: 2021-09-27 — End: 2021-09-27
  Administered 2021-09-27: 200 ug via INTRAVENOUS
  Administered 2021-09-27: 50 ug via INTRAVENOUS

## 2021-09-27 MED ORDER — HEMOSTATIC AGENTS (NO CHARGE) OPTIME
TOPICAL | Status: DC | PRN
  Administered 2021-09-27: 1 via TOPICAL

## 2021-09-27 MED ORDER — CEFAZOLIN SODIUM-DEXTROSE 2-4 GM/100ML-% IV SOLN
INTRAVENOUS | Status: AC
  Filled 2021-09-27: qty 100

## 2021-09-27 MED ORDER — SODIUM CHLORIDE 0.9 % IV SOLN
2.0000 g | Freq: Once | INTRAVENOUS | Status: AC
  Administered 2021-09-27: 2 g via INTRAVENOUS

## 2021-09-27 MED ORDER — FUROSEMIDE 20 MG PO TABS: 20.0000 mg | ORAL_TABLET | Freq: Every day | ORAL | Status: DC

## 2021-09-27 MED ORDER — SUGAMMADEX SODIUM 200 MG/2ML IV SOLN
950.0000 mg | Freq: Once | INTRAVENOUS | Status: AC
  Administered 2021-09-27: 950 mg via INTRAVENOUS
  Filled 2021-09-27 (×2): qty 10

## 2021-09-27 MED ORDER — THROMBIN 5000 UNITS EX SOLR
CUTANEOUS | Status: AC
  Filled 2021-09-27: qty 5000

## 2021-09-27 MED ORDER — MORPHINE SULFATE (PF) 4 MG/ML IV SOLN: 4.0000 mg | INTRAVENOUS | Status: DC | PRN

## 2021-09-27 MED ORDER — THROMBIN 5000 UNITS EX SOLR: OROMUCOSAL | Status: DC | PRN

## 2021-09-27 MED ORDER — FENTANYL BOLUS VIA INFUSION
50.0000 ug | INTRAVENOUS | Status: DC | PRN
  Filled 2021-09-27: qty 100

## 2021-09-27 MED ORDER — TRANEXAMIC ACID-NACL 1000-0.7 MG/100ML-% IV SOLN
1000.0000 mg | Freq: Once | INTRAVENOUS | Status: AC
  Administered 2021-09-27: 1000 mg via INTRAVENOUS
  Filled 2021-09-27: qty 100

## 2021-09-27 MED ORDER — DEXMEDETOMIDINE HCL IN NACL 400 MCG/100ML IV SOLN
INTRAVENOUS | Status: AC
  Filled 2021-09-27: qty 100

## 2021-09-27 MED ORDER — PROPOFOL 10 MG/ML IV BOLUS
INTRAVENOUS | Status: AC
  Filled 2021-09-27: qty 20

## 2021-09-27 MED ORDER — ALBUMIN HUMAN 5 % IV SOLN
25.0000 g | Freq: Once | INTRAVENOUS | Status: AC
  Administered 2021-09-27: 25 g via INTRAVENOUS
  Filled 2021-09-27: qty 500

## 2021-09-27 MED ORDER — CALCIUM CHLORIDE 10 % IV SOLN
INTRAVENOUS | Status: AC
  Filled 2021-09-27: qty 10

## 2021-09-27 MED ORDER — BUPIVACAINE-EPINEPHRINE 0.5% -1:200000 IJ SOLN
INTRAMUSCULAR | Status: AC
  Filled 2021-09-27: qty 1

## 2021-09-27 MED ORDER — ONDANSETRON HCL 4 MG PO TABS: 4.0000 mg | ORAL_TABLET | ORAL | Status: DC | PRN

## 2021-09-27 MED ORDER — SODIUM CHLORIDE 0.9 % IV SOLN
2.0000 g | Freq: Three times a day (TID) | INTRAVENOUS | Status: DC
  Filled 2021-09-27: qty 2

## 2021-09-27 MED ORDER — THROMBIN 20000 UNITS EX SOLR
CUTANEOUS | Status: AC
  Filled 2021-09-27: qty 20000

## 2021-09-27 MED ORDER — CEFAZOLIN SODIUM-DEXTROSE 2-3 GM-%(50ML) IV SOLR: INTRAVENOUS | Status: DC | PRN

## 2021-09-27 MED ORDER — SODIUM CHLORIDE 0.9 % IV SOLN
INTRAVENOUS | Status: DC | PRN
Start: 2021-09-27 — End: 2021-09-27

## 2021-09-27 MED ORDER — TRANEXAMIC ACID 1000 MG/10ML IV SOLN
1000.0000 mg | Freq: Once | INTRAVENOUS | Status: DC
  Filled 2021-09-27: qty 10

## 2021-09-27 MED ORDER — VASOPRESSIN 20 UNIT/ML IV SOLN
INTRAVENOUS | Status: AC
  Filled 2021-09-27: qty 1

## 2021-09-27 MED ORDER — CALCIUM CHLORIDE 10 % IV SOLN
INTRAVENOUS | Status: DC | PRN
  Administered 2021-09-27: 200 meq via INTRAVENOUS

## 2021-09-27 MED ORDER — ALBUMIN HUMAN 5 % IV SOLN: INTRAVENOUS | Status: DC | PRN

## 2021-09-27 MED ORDER — POTASSIUM CHLORIDE 10 MEQ/100ML IV SOLN
10.0000 meq | INTRAVENOUS | Status: AC
  Administered 2021-09-27 – 2021-09-28 (×6): 10 meq via INTRAVENOUS
  Filled 2021-09-27 (×6): qty 100

## 2021-09-27 MED ORDER — SODIUM BICARBONATE 8.4 % IV SOLN
150.0000 meq | Freq: Once | INTRAVENOUS | Status: AC
  Administered 2021-09-27: 150 meq via INTRAVENOUS
  Filled 2021-09-27: qty 150

## 2021-09-27 MED ORDER — NOREPINEPHRINE 4 MG/250ML-% IV SOLN
0.0000 ug/min | INTRAVENOUS | Status: DC
  Administered 2021-09-27: 35 ug/min via INTRAVENOUS
  Administered 2021-09-27: 29 ug/min via INTRAVENOUS
  Administered 2021-09-27: 15 ug/min via INTRAVENOUS
  Administered 2021-09-28: 35 ug/min via INTRAVENOUS
  Filled 2021-09-27 (×3): qty 250

## 2021-09-27 MED ORDER — POTASSIUM CHLORIDE IN NACL 20-0.9 MEQ/L-% IV SOLN: INTRAVENOUS | Status: DC

## 2021-09-27 MED ORDER — BUPIVACAINE-EPINEPHRINE 0.5% -1:200000 IJ SOLN
INTRAMUSCULAR | Status: DC | PRN
Start: 2021-09-27 — End: 2021-09-27
  Administered 2021-09-27: 10 mL

## 2021-09-27 MED ORDER — DOCUSATE SODIUM 50 MG/5ML PO LIQD: 100.0000 mg | Freq: Two times a day (BID) | ORAL | Status: DC

## 2021-09-27 MED ORDER — LORAZEPAM 1 MG PO TABS: 1.0000 mg | ORAL_TABLET | ORAL | Status: DC | PRN

## 2021-09-27 MED ORDER — BACITRACIN ZINC 500 UNIT/GM EX OINT
TOPICAL_OINTMENT | CUTANEOUS | Status: AC
  Filled 2021-09-27: qty 28.35

## 2021-09-27 MED ORDER — FENTANYL CITRATE PF 50 MCG/ML IJ SOSY: 50.0000 ug | PREFILLED_SYRINGE | Freq: Once | INTRAMUSCULAR | Status: DC

## 2021-09-27 MED ORDER — PANTOPRAZOLE SODIUM 40 MG IV SOLR: 40.0000 mg | Freq: Every day | INTRAVENOUS | Status: DC

## 2021-09-27 MED ORDER — CEFAZOLIN SODIUM-DEXTROSE 2-4 GM/100ML-% IV SOLN: 2.0000 g | Freq: Three times a day (TID) | INTRAVENOUS | Status: DC

## 2021-09-27 MED ORDER — THIAMINE HCL 100 MG/ML IJ SOLN: 100.0000 mg | Freq: Every day | INTRAMUSCULAR | Status: DC

## 2021-09-27 MED ORDER — ONDANSETRON HCL 4 MG/2ML IJ SOLN: 4.0000 mg | INTRAMUSCULAR | Status: DC | PRN

## 2021-09-27 MED ORDER — HYDROCORTISONE SOD SUC (PF) 100 MG IJ SOLR
100.0000 mg | Freq: Three times a day (TID) | INTRAMUSCULAR | Status: DC
  Administered 2021-09-28: 100 mg via INTRAVENOUS
  Filled 2021-09-27: qty 2

## 2021-09-27 MED ORDER — CEFAZOLIN SODIUM-DEXTROSE 2-3 GM-%(50ML) IV SOLR
INTRAVENOUS | Status: DC | PRN
  Administered 2021-09-27: 2 g via INTRAVENOUS

## 2021-09-27 MED ORDER — FENTANYL 2500MCG IN NS 250ML (10MCG/ML) PREMIX INFUSION
50.0000 ug/h | INTRAVENOUS | Status: DC
  Administered 2021-09-27: 50 ug/h via INTRAVENOUS
  Filled 2021-09-27: qty 250

## 2021-09-27 MED ORDER — LEVETIRACETAM IN NACL 500 MG/100ML IV SOLN
500.0000 mg | Freq: Two times a day (BID) | INTRAVENOUS | Status: DC
  Administered 2021-09-27: 500 mg via INTRAVENOUS
  Filled 2021-09-27: qty 100

## 2021-09-27 MED ORDER — ADULT MULTIVITAMIN W/MINERALS CH: 1.0000 | ORAL_TABLET | Freq: Every day | ORAL | Status: DC

## 2021-09-27 MED ORDER — LACTATED RINGERS IV SOLN: INTRAVENOUS | Status: DC | PRN

## 2021-09-27 MED ORDER — POLYETHYLENE GLYCOL 3350 17 G PO PACK: 17.0000 g | PACK | Freq: Every day | ORAL | Status: DC

## 2021-09-27 MED ORDER — LABETALOL HCL 5 MG/ML IV SOLN: 10.0000 mg | INTRAVENOUS | Status: DC | PRN

## 2021-09-27 MED ORDER — SODIUM CHLORIDE 0.9 % IV SOLN
2.0000 g | Freq: Two times a day (BID) | INTRAVENOUS | Status: DC
  Administered 2021-09-27: 2 g via INTRAVENOUS

## 2021-09-27 MED ORDER — PHENYLEPHRINE 40 MCG/ML (10ML) SYRINGE FOR IV PUSH (FOR BLOOD PRESSURE SUPPORT)
PREFILLED_SYRINGE | INTRAVENOUS | Status: AC
  Filled 2021-09-27: qty 10

## 2021-09-27 MED ORDER — MORPHINE SULFATE (PF) 2 MG/ML IV SOLN: 2.0000 mg | INTRAVENOUS | Status: DC | PRN

## 2021-09-27 MED ORDER — THROMBIN 20000 UNITS EX SOLR: CUTANEOUS | Status: DC | PRN

## 2021-09-27 MED ORDER — ONDANSETRON HCL 4 MG/2ML IJ SOLN
4.0000 mg | Freq: Four times a day (QID) | INTRAMUSCULAR | Status: DC | PRN
Start: 2021-09-27 — End: 2021-09-28

## 2021-09-27 MED ORDER — OXYCODONE HCL 5 MG PO TABS: 5.0000 mg | ORAL_TABLET | ORAL | Status: DC | PRN

## 2021-09-27 MED ORDER — VANCOMYCIN HCL IN DEXTROSE 1-5 GM/200ML-% IV SOLN
1000.0000 mg | Freq: Once | INTRAVENOUS | Status: AC
  Administered 2021-09-27: 1000 mg via INTRAVENOUS

## 2021-09-27 MED ORDER — IOHEXOL 350 MG/ML SOLN
100.0000 mL | Freq: Once | INTRAVENOUS | Status: AC | PRN
  Administered 2021-09-27: 100 mL via INTRAVENOUS

## 2021-09-27 MED ORDER — FENTANYL CITRATE (PF) 250 MCG/5ML IJ SOLN
INTRAMUSCULAR | Status: AC
  Filled 2021-09-27: qty 5

## 2021-09-27 MED ORDER — REMIFENTANIL HCL 1 MG IV SOLR
0.1500 ug/kg/min | INTRAVENOUS | Status: DC
Start: 2021-09-27 — End: 2021-09-27

## 2021-09-27 MED ORDER — VANCOMYCIN HCL 1250 MG/250ML IV SOLN
1250.0000 mg | INTRAVENOUS | Status: DC
  Filled 2021-09-27: qty 250

## 2021-09-27 MED ORDER — VASOPRESSIN 20 UNIT/ML IV SOLN: INTRAVENOUS | Status: DC | PRN

## 2021-09-27 MED ORDER — SODIUM CHLORIDE 0.9 % IV BOLUS
2000.0000 mL | Freq: Once | INTRAVENOUS | Status: AC
  Administered 2021-09-27: 2000 mL via INTRAVENOUS

## 2021-09-27 MED ORDER — SODIUM CHLORIDE 0.9 % IV SOLN
2.0000 g | Freq: Once | INTRAVENOUS | Status: AC
  Administered 2021-09-27: 2 g via INTRAVENOUS
  Filled 2021-09-27: qty 20

## 2021-09-27 MED ORDER — ETOMIDATE 2 MG/ML IV SOLN
INTRAVENOUS | Status: DC | PRN
Start: 2021-09-27 — End: 2021-09-27
  Administered 2021-09-27: 20 mg via INTRAVENOUS

## 2021-09-27 MED ORDER — MAGNESIUM SULFATE 4 GM/100ML IV SOLN
4.0000 g | Freq: Once | INTRAVENOUS | Status: AC
  Administered 2021-09-27: 4 g via INTRAVENOUS
  Filled 2021-09-27: qty 100

## 2021-09-27 MED ORDER — 0.9 % SODIUM CHLORIDE (POUR BTL) OPTIME
TOPICAL | Status: DC | PRN
Start: 2021-09-27 — End: 2021-09-27
  Administered 2021-09-27: 3000 mL
  Administered 2021-09-27: 1000 mL

## 2021-09-27 MED ORDER — SUGAMMADEX SODIUM 200 MG/2ML IV SOLN: 16.0000 mg/kg | Freq: Once | INTRAVENOUS | Status: DC

## 2021-09-27 MED ORDER — LORAZEPAM 2 MG/ML IJ SOLN
1.0000 mg | INTRAMUSCULAR | Status: DC | PRN
  Administered 2021-09-28: 2 mg via INTRAVENOUS
  Filled 2021-09-27: qty 1

## 2021-09-27 MED ORDER — SODIUM CHLORIDE 0.9 % IV SOLN: INTRAVENOUS | Status: DC

## 2021-09-27 SURGICAL SUPPLY — 45 items
BAG COUNTER SPONGE SURGICOUNT (BAG) ×3 IMPLANT
BAG SPNG CNTER NS LX DISP (BAG) ×2
BLADE CLIPPER SPEC (BLADE) ×2 IMPLANT
BUR PRECISION FLUTE 6.0 (BURR) ×2 IMPLANT
BUR SPIRAL ROUTER 2.3 (BUR) ×1 IMPLANT
CANISTER SUCT 3000ML PPV (MISCELLANEOUS) ×3 IMPLANT
CARTRIDGE OIL MAESTRO DRILL (MISCELLANEOUS) ×1 IMPLANT
DIFFUSER DRILL AIR PNEUMATIC (MISCELLANEOUS) ×2 IMPLANT
DRAPE NEUROLOGICAL W/INCISE (DRAPES) ×2 IMPLANT
DRAPE WARM FLUID 44X44 (DRAPES) ×2 IMPLANT
ELECT REM PT RETURN 9FT ADLT (ELECTROSURGICAL) ×2
ELECTRODE REM PT RTRN 9FT ADLT (ELECTROSURGICAL) ×1 IMPLANT
EVACUATOR SILICONE 100CC (DRAIN) ×1 IMPLANT
FORCEPS BIPOLAR SPETZLER 8 1.0 (NEUROSURGERY SUPPLIES) ×1 IMPLANT
GAUZE 4X4 16PLY ~~LOC~~+RFID DBL (SPONGE) ×1 IMPLANT
GAUZE SPONGE 4X4 12PLY STRL LF (GAUZE/BANDAGES/DRESSINGS) ×1 IMPLANT
GLOVE SURG ENC MOIS LTX SZ8 (GLOVE) ×3 IMPLANT
GLOVE SURG ENC MOIS LTX SZ8.5 (GLOVE) ×3 IMPLANT
GOWN STRL REUS W/ TWL LRG LVL3 (GOWN DISPOSABLE) IMPLANT
GOWN STRL REUS W/ TWL XL LVL3 (GOWN DISPOSABLE) IMPLANT
GOWN STRL REUS W/TWL LRG LVL3 (GOWN DISPOSABLE)
GOWN STRL REUS W/TWL XL LVL3 (GOWN DISPOSABLE)
HEMOSTAT POWDER KIT SURGIFOAM (HEMOSTASIS) ×3 IMPLANT
KIT BASIN OR (CUSTOM PROCEDURE TRAY) ×2 IMPLANT
KIT TURNOVER KIT B (KITS) ×2 IMPLANT
MARKER SKIN DUAL TIP RULER LAB (MISCELLANEOUS) IMPLANT
NEEDLE HYPO 22GX1.5 SAFETY (NEEDLE) ×2 IMPLANT
NS IRRIG 1000ML POUR BTL (IV SOLUTION) ×5 IMPLANT
OIL CARTRIDGE MAESTRO DRILL (MISCELLANEOUS) ×2
PACK BATTERY CMF DISP FOR DVR (ORTHOPEDIC DISPOSABLE SUPPLIES) ×1 IMPLANT
PACK CRANIOTOMY CUSTOM (CUSTOM PROCEDURE TRAY) ×2 IMPLANT
PIN MAYFIELD SKULL DISP (PIN) ×1 IMPLANT
PLATE BONE 12 2H TARGET XL (Plate) ×4 IMPLANT
SCREW UNIII AXS SD 1.5X4 (Screw) ×8 IMPLANT
SPONGE NEURO XRAY DETECT 1X3 (DISPOSABLE) ×1 IMPLANT
SPONGE SURGIFOAM ABS GEL 100 (HEMOSTASIS) ×2 IMPLANT
SPONGE T-LAP 4X18 ~~LOC~~+RFID (SPONGE) ×1 IMPLANT
STAPLER SKIN PROX WIDE 3.9 (STAPLE) ×2 IMPLANT
SUT NURALON 4 0 TR CR/8 (SUTURE) ×4 IMPLANT
SUT PROLENE 6 0 BV (SUTURE) IMPLANT
SUT VIC AB 2-0 CP2 18 (SUTURE) ×4 IMPLANT
TOWEL GREEN STERILE (TOWEL DISPOSABLE) ×2 IMPLANT
TOWEL GREEN STERILE FF (TOWEL DISPOSABLE) ×2 IMPLANT
UNDERPAD 30X36 HEAVY ABSORB (UNDERPADS AND DIAPERS) ×1 IMPLANT
WATER STERILE IRR 1000ML POUR (IV SOLUTION) ×2 IMPLANT

## 2021-09-27 NOTE — ED Notes (Signed)
Patient transported to CT via stretcher accompanied by Hennie Duos and RT on ED transport monitor.

## 2021-09-27 NOTE — ED Notes (Signed)
EMS C-collar switched out for [facility] J at this time. C-spine maintained.

## 2021-09-27 NOTE — ED Notes (Addendum)
Dr. Bobbye Morton, trauma MD at bedside assessing pt.
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
## 2021-09-27 NOTE — ED Notes (Signed)
Pt logrolled at this time to examine back. Cspine maintained.

## 2021-09-27 NOTE — ED Notes (Signed)
C-collar removed by Dr. Bobbye Morton.
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
## 2021-09-27 NOTE — ED Notes (Signed)
CXR completed at this time. Per Dr. Bobbye Morton, ETT needs to be pulled back 1.5 cm, otherwise WNL.

## 2021-09-27 NOTE — Progress Notes (Signed)
Blood consent obtained, placed in shadow chart.

## 2021-09-27 NOTE — Progress Notes (Signed)
Patient seen and examined immediately post-op. Levo and vaso running through PIV up to ICU. Fem art line in place. Loss of PIV access on arrival to ICU. Hypotensive with MAP 40s. Epi added to pressors and SDS initiated. Dr. Roanna Banning placed L IJ CVC and I placed R femoral CVC. CBC with hgb 6.2, 2u pRBC ordered. Plts 52, 1u plts ordered. 2g calcium ordered. TEG ordered. Clinical update provided to patients father and stepmother at bedside. They confirm she is a heavy alcohol user and has been since age 42. Preferred EtOH is vodka, ordered. Discussed high risk of morbidity due to liver disease, they verbalize understanding. Notified them of palliative c/s, they are very open. Patient lives with her mother who is coming later this evening. Plan for repeat head CT in AM. Discussed that the next 48-72h will provide additional information for prognostication. They were provided an update post-op by Dr. Arnoldo Morale.   Additional critical care time: 15min  Jesusita Oka, MD General and [facility]

## 2021-09-27 NOTE — Anesthesia Procedure Notes (Signed)
Arterial Line Insertion Start/End01/16/2023 4:25 PM, 10/13/2021 4:45 PM Performed by: Murvin Natal, MD, anesthesiologist  Patient location: OR. Preanesthetic checklist: patient identified, IV checked, site marked, monitors and equipment checked, pre-op evaluation and timeout performed Emergency situation Patient sedated Left, femoral was placed Catheter size: 20 G Hand hygiene performed , maximum sterile barriers used  and Seldinger technique used  Attempts: 1 Procedure performed using ultrasound guided technique. Ultrasound Notes:anatomy identified, needle tip was noted to be adjacent to the nerve/plexus identified and no ultrasound evidence of intravascular and/or intraneural injection Following insertion, dressing applied, Biopatch and line sutured. Post procedure assessment: normal and unchanged  Patient tolerated the procedure well with no immediate complications.

## 2021-09-27 NOTE — ED Notes (Signed)
Pt intubated with 7.5 ETT by Dr. Vanita Panda at this time. Positive color change noted. Secured 26 @ lip.

## 2021-09-27 NOTE — ED Notes (Signed)
Femoral stick for labs performed by Dr. Bobbye Morton at this time.

## 2021-09-27 NOTE — Consult Note (Signed)
Reason for Consult: Right subdural hematoma Referring Physician: Dr. Ritta Slot Jill Farmer is an 41 y.o. female.  HPI: The patient is a 41 year old alcoholic white female with a history of thrombocytopenia, cirrhosis of the liver, etc.  She was brought in by EMS unconscious reportedly having fallen out of bed and had a seizure.  She was intubated and a head CT was obtained which demonstrated a large right subdural hematoma with a right intraparenchymal hemorrhage.  There is significant midline shift.  A neurosurgical consultation was requested.  I immediately reviewed the patient's head CT scan and posted the patient for emergency surgery at 1312.  Presently the patient is debated.  Her paralytics have been reversed.  Past Medical History:  Diagnosis Date   Hypoglycemia    Seizures ([place])    single episode 2017    Past Surgical History:  Procedure Laterality Date   IR PARACENTESIS  08/22/2020    No family history on file.  Social History:  reports that she has never smoked. She has never used smokeless tobacco. No history on file for alcohol use and drug use.  Allergies:  Allergies  Allergen Reactions   Aspirin Anaphylaxis   Ibuprofen Anaphylaxis   Nsaids Anaphylaxis    Other reaction(s): anaphylaxis   Sulfa Antibiotics Other (See Comments)    Unknown Unknown-states she was advised by her mom not to take it due to an incident when she was 7.   Acetaminophen Other (See Comments)    Liver Issues    Medications: I have reviewed the patient's current medications. Prior to Admission: (Not in a hospital admission)  Scheduled:  docusate sodium  100 mg Oral BID   folic acid  1 mg Per Tube Daily   methocarbamol  1,000 mg Per Tube Q8H   multivitamin with minerals  1 tablet Per Tube Daily   sugammadex sodium  950 mg Intravenous Once   thiamine  100 mg Oral Daily   Or   thiamine  100 mg Intravenous Daily   Continuous:  sodium chloride     levETIRAcetam     tranexamic acid      Followed by   tranexamic acid (CYKLOKAPRON) infusion (TRAUMA)     AYT:KZSWFUXNA **OR** LORazepam, morphine injection, ondansetron **OR** ondansetron (ZOFRAN) IV, oxyCODONE Anti-infectives (From admission, onward)    Start     Dose/Rate Route Frequency Ordered Stop   09/24/2021 1245  ceFEPIme (MAXIPIME) 2 g in sodium chloride 0.9 % 100 mL IVPB        2 g 200 mL/hr over 30 Minutes Intravenous  Once 10/13/2021 1244 10/10/2021 1310   10/13/2021 1245  vancomycin (VANCOCIN) IVPB 1000 mg/200 mL premix        1,000 mg 200 mL/hr over 60 Minutes Intravenous  Once 09/22/2021 1244 09/30/2021 1348        Results for orders placed or performed during the hospital encounter of 10/16/2021 (from the past 48 hour(s))  CBG monitoring, ED     Status: Abnormal   Collection Time: 10/15/2021 12:42 PM  Result Value Ref Range   Glucose-Capillary 105 (H) 70 - 99 mg/dL    Comment: Glucose reference range applies only to samples taken after fasting for at least 8 hours.  CBC     Status: Abnormal (Preliminary result)   Collection Time: 10/19/2021 12:45 PM  Result Value Ref Range   WBC 10.0 4.0 - 10.5 K/uL   RBC 3.42 (L) 3.87 - 5.11 MIL/uL   Hemoglobin 10.9 (L) 12.0 - 15.0  g/dL   HCT 35.9 (L) 36.0 - 46.0 %   MCV 105.0 (H) 80.0 - 100.0 fL   MCH 31.9 26.0 - 34.0 pg   MCHC 30.4 30.0 - 36.0 g/dL   RDW 19.0 (H) 11.5 - 15.5 %   Platelets PENDING 150 - 400 K/uL   nRBC 0.9 (H) 0.0 - 0.2 %    Comment: Performed at [facility] [address] [place], [place] [address]  Sample to Blood Bank     Status: None   Collection Time: 10/03/2021 12:45 PM  Result Value Ref Range   Blood Bank Specimen SAMPLE AVAILABLE FOR TESTING    Sample Expiration      October 01, 2021,2359 Performed at [facility], [place] [address], [place], [place] [address]   I-Stat Chem 8, ED     Status: Abnormal   Collection Time: 09/25/2021 12:57 PM  Result Value Ref Range   Sodium 135 135 - 145 mmol/L   Potassium 3.5 3.5 - 5.1 mmol/L   Chloride 104 98  - 111 mmol/L   BUN 9 6 - 20 mg/dL   Creatinine, Ser 0.90 0.44 - 1.00 mg/dL   Glucose, Bld 105 (H) 70 - 99 mg/dL    Comment: Glucose reference range applies only to samples taken after fasting for at least 8 hours.   Calcium, Ion 0.98 (L) 1.15 - 1.40 mmol/L   TCO2 9 (L) 22 - 32 mmol/L   Hemoglobin 11.2 (L) 12.0 - 15.0 g/dL   HCT 33.0 (L) 36.0 - 46.0 %    CT Head Wo Contrast  Result Date: 10/15/2021 CLINICAL DATA:  Head trauma, moderate to severe, mental status change. EXAM: CT HEAD WITHOUT CONTRAST TECHNIQUE: Contiguous axial images were obtained from the base of the skull through the vertex without intravenous contrast. COMPARISON:  CT head dated April 25, 2021 FINDINGS: Brain: There is heterogeneous collection in the right extra-axial space likely representing acute subdural hemorrhage in the background of prior subacute subdural hemorrhage. This hemorrhage at the level of the right temporal lobe measures up to 1.5 cm. There is a small intracerebral hemorrhage in the right frontal lobe measuring 0 0.7 x 0.9 x 0.9 cm. There is mass effect with approximately 5 mm right-to-left midline shift. There is also multiple foci of subarachnoid hemorrhage in the left high frontal lobe. Trace amount of hemorrhage along the posterior falx as well as along the tentorium. Vascular: No hyperdense vessel or unexpected calcification. Skull: Right midline frontal scalp hematoma without evidence of calvarial fracture. Sinuses/Orbits: Patchy opacification of the ethmoid air cells. Mucosal thickening of the left maxillary sinus. The remaining paranasal sinuses and mastoid air cells are clear. Other: None. IMPRESSION: 1. Acute right cerebral convexity hemorrhage, likely in the background of prior subacute subdural hemorrhage with edema and approximately 5 mm right-to-left midline shift. There is also small right intra parenchymal hemorrhage in the right frontal lobe measuring 0.7 x 0.9 x 0.9 cm. Multiple foci of subarachnoid  hemorrhage in the high left frontal lobe. Trace amount of hemorrhage along the posterior falx cerebri as well as along the tentorium. 2. Midline frontal scalp hematoma without evidence of calvarial fracture. Electronically Signed   By: Keane Police D.O.   On: 10/19/2021 13:21   CT Cervical Spine Wo Contrast  Result Date: 10/15/2021 CLINICAL DATA:  Fall. EXAM: CT CERVICAL SPINE WITHOUT CONTRAST TECHNIQUE: Multidetector CT imaging of the cervical spine was performed without intravenous contrast. Multiplanar CT image reconstructions were also generated. COMPARISON:  03/31/2020 FINDINGS: Alignment: Normal. Skull base and vertebrae: No acute fracture. No primary bone lesion or focal pathologic process. Soft tissues and spinal canal: No prevertebral fluid or swelling. No visible canal hematoma. Disc levels: Disc space narrowing and endplate spurring noted at C5-6 and C6-7. Upper chest: Negative. Other: None IMPRESSION: 1. No evidence for cervical spine fracture. 2. Cervical degenerative disc disease. Electronically Signed   By: Kerby Moors M.D.   On: 10/15/2021 13:36   DG Pelvis Portable  Result Date: 10/04/2021 CLINICAL DATA:  Trauma, fall, seizure, head injury EXAM: PORTABLE PELVIS 1-2 VIEWS COMPARISON:  None. FINDINGS: There is no evidence of displaced pelvic fracture or diastasis. No pelvic bone lesions are seen. IMPRESSION: No displaced fracture or dislocation of the pelvis. Electronically Signed   By: Delanna Ahmadi M.D.   On: 10/06/2021 12:57   CT CHEST ABDOMEN PELVIS W CONTRAST  Result Date: 10/21/2021 CLINICAL DATA:  Abdominal trauma.  Reportedly fell from bed. EXAM: CT CHEST, ABDOMEN, AND PELVIS WITH CONTRAST TECHNIQUE: Multidetector CT imaging of the chest, abdomen and pelvis was performed following the standard protocol during bolus administration of intravenous contrast. CONTRAST:  144mL OMNIPAQUE IOHEXOL 350 MG/ML SOLN COMPARISON:  CT AP 08/21/2020 FINDINGS: CT CHEST FINDINGS Cardiovascular:  Normal heart size.  No pericardial effusion. Mediastinum/Nodes: Soft tissue stranding is identified within the left lower cervical and left supraclavicular region. ET tube tip is above the carina. There is a NG tube with tip in the stomach. No mediastinal or hilar adenopathy. Lungs/Pleura: No pleural effusion or pneumothorax. Ground-glass and airspace density within the left upper lobe measures 2.6 cm, image 62/5 and may represent either acute pulmonary contusion or focal area of pneumonia. Several small lung nodules are noted within the periphery of the left upper lobe which measure up to 3 mm, image 54/5. Dependent changes are noted overlying the posterior lungs. Musculoskeletal: Subacute to chronic left seventh rib fracture, image 119/5. No acute osseous findings identified. CT ABDOMEN PELVIS FINDINGS Hepatobiliary: Severe changes of nodular cirrhosis with marked diffuse fatty infiltration. These changes diminish sensitivity for detecting subtle liver injury. Within this limitation there is no convincing evidence for a liver laceration or contusion. Gallstones identified within the gallbladder. No signs of bile duct dilatation. Pancreas: Unremarkable. No pancreatic ductal dilatation or surrounding inflammatory changes. Spleen: The spleen is enlarged measuring 13.3 cm in cranial caudal dimension. No signs of splenic injury or perisplenic hematoma. Adrenals/Urinary Tract: Adrenal glands are normal. No signs of adrenal hemorrhage or renal injury. Bladder unremarkable. Stomach/Bowel: Enteric tube tip is in the body of stomach. Stomach appears decompressed. Small bowel loops are unremarkable. Diffuse edema involving the colonic wall is identified which in the setting of advanced cirrhosis with portal venous hypertension is nonspecific. This may reflect portal hypertensive colopathy versus inflammatory/infectious colitis. Vascular/Lymphatic: There is a normal caliber abdominal aorta. The right portal vein is not  visualized, similar to previous exam and is likely chronically occluded. Signs of partial cavernous transformation noted. Recanalization of the umbilical vein which appears markedly enlarged with extensive collaterals extending along the undersurface of the ventral abdominal wall. No abdominopelvic adenopathy. Reproductive: The endometrium appears mildly thickened measuring 1.9 cm and AP dimension, image 58/7. No adnexal mass noted. Other: Trace ascites within the abdomen and pelvis. Musculoskeletal: Chronic bilateral L5 pars defects identified. No acute or suspicious osseous findings identified. IMPRESSION: 1. Ground-glass and airspace density within the left upper lobe may represent either acute pulmonary contusion or focal area of pneumonia. 2. Subacute to chronic left  seventh rib fracture. 3. Morphologic features of the liver compatible with advanced cirrhosis and portal venous hypertension. These changes diminish sensitivity for detecting subtle liver injury. Within this limitation there is no convincing evidence for a liver laceration or contusion. 4. 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 5. Diffuse edema involving the colonic wall is identified which in the setting of advanced cirrhosis with portal venous hypertension is nonspecific. This may reflect portal hypertensive colopathy versus inflammatory/infectious colitis. 6. Mild thickening of the endometrium. Consider further evaluation with pelvic sonogram. 7. Gallstones. 8. Soft tissue stranding within the left lower cervical and left supraclavicular region. Findings are likely the sequelae of soft tissue injury in the setting of acute fall. No underlying clavicle fracture identified. 9. Aortic Atherosclerosis (ICD10-I70.0). Electronically Signed   By: Kerby Moors M.D.   On: 10/07/2021 13:32   DG Chest Port 1 View  Result Date: 10/04/2021 CLINICAL DATA:  Trauma, fall, seizure, head injury EXAM: PORTABLE CHEST  1 VIEW COMPARISON:  08/21/2020 FINDINGS: Endotracheal intubation, tip just above the carina, approximately 1 cm. Mild cardiomegaly. No acute airspace opacity. Minimally displaced fracture of the left seventh rib. IMPRESSION: 1. Endotracheal intubation, tip just above the carina, approximately 1 cm. Consider slight retraction to mid tracheal position. 2. No acute airspace opacity. 3. Minimally displaced fracture of the left seventh rib. No associated pneumothorax. Electronically Signed   By: Delanna Ahmadi M.D.   On: 10/17/2021 12:54    ROS: Unobtainable Blood pressure (!) 91/45, pulse (!) 125, temperature 98.6 F (37 C), resp. rate (!) 30, height 5\' 7"  (1.702 m), weight 65.8 kg, SpO2 100 %. Estimated body mass index is 22.71 kg/m as calculated from the following:   Height as of this encounter: 5\' 7"  (1.702 m).   Weight as of this encounter: 65.8 kg.  Physical Exam  General: A thin unhealthy appearing 41 year old white female intubated and breathing over the vent.  HEENT: The patient has a midline frontal abrasion and swelling.  Her pupils are extremely dilated bilaterally and equal.  Neck: Unremarkable in a cervical collar  Thorax: Symmetric  Abdomen: Soft  Extremities: Unremarkable  Neurologic exam: Glasgow Coma Scale 7, intubated, [id number].  She opens her eyes to painful stimuli and flexes her upper extremities.  Her pupils are as above.  She will move her lower extremities.  She breathes over the ventilator.  I have reviewed the patient's head CT performed at [facility] today.  It demonstrates a large right mixed density subdural hematoma with a right anterior temporal interval parenchymal hemorrhage.  The patient's brainstem appears hypodense.  There is significant midline shift.  I have also reviewed the patient's cervical CT performed at [facility].  She has degenerative change but no acute findings.  Assessment/Plan: Right subdural hematoma, right intracranial  hemorrhage: I have spoken to the patient's mother at [phone] and appraised her of the situation.  We discussed the various treatment options including doing nothing versus a right craniotomy to evacuate the subdural hematoma.  I described that surgery.  We have discussed the risks including risk of anesthesia, infection, recurrent hemorrhage, seizures, stroke, death, medical risk etc.  She understands the patient is gravely ill and may not recover with or without surgery.  I have answered all her questions.  She has consented on behalf of the patient.  Coagulopathy/thrombocytopenia: Labs are pending.  Alcohol abuse/liver cirrhosis: Noted  Ophelia Charter 09/30/2021, 1:49 PM

## 2021-09-27 NOTE — ED Notes (Signed)
Dr. Arnoldo Morale, neurosurgery, paged and answered at this time.

## 2021-09-27 NOTE — H&P (Addendum)
TRAUMA H&P  10/01/2021, 12:58 PM   Chief Complaint: Level 1 trauma activation for GCS10  The patient is an 41 y.o. female.   HPI: 52F s/p reported fall out of bed. Unusual behavior the recent days prior to presentation. Seizure en route per EMS. No sz hx, but did have one EtOH withdrawal sz in the past. Reportedly febrile at home to 103. Otherwise history is limited, no family present. Intubated for airway protection.  Past Medical History:  Diagnosis Date   Hypoglycemia    Seizures ([place])    single episode 2017    Past Surgical History:  Procedure Laterality Date   IR PARACENTESIS  08/22/2020    No pertinent family history.  Social History:  reports that she has never smoked. She has never used smokeless tobacco. No history on file for alcohol use and drug use.     Allergies:  Allergies  Allergen Reactions   Aspirin Anaphylaxis   Ibuprofen Anaphylaxis   Nsaids Anaphylaxis    Other reaction(s): anaphylaxis   Sulfa Antibiotics Other (See Comments)    Unknown Unknown-states she was advised by her mom not to take it due to an incident when she was 7.   Acetaminophen Other (See Comments)    Liver Issues    Medications: reviewed  Results for orders placed or performed during the hospital encounter of 10/20/2021 (from the past 48 hour(s))  CBG monitoring, ED     Status: Abnormal   Collection Time: 10/15/2021 12:42 PM  Result Value Ref Range   Glucose-Capillary 105 (H) 70 - 99 mg/dL    Comment: Glucose reference range applies only to samples taken after fasting for at least 8 hours.    DG Chest Port 1 View  Result Date: 09/26/2021 CLINICAL DATA:  Trauma, fall, seizure, head injury EXAM: PORTABLE CHEST 1 VIEW COMPARISON:  08/21/2020 FINDINGS: Endotracheal intubation, tip just above the carina, approximately 1 cm. Mild cardiomegaly. No acute airspace opacity. Minimally displaced fracture of the left seventh rib. IMPRESSION: 1. Endotracheal intubation, tip just above the carina,  approximately 1 cm. Consider slight retraction to mid tracheal position. 2. No acute airspace opacity. 3. Minimally displaced fracture of the left seventh rib. No associated pneumothorax. Electronically Signed   By: Delanna Ahmadi M.D.   On: 10/09/2021 12:54    ROS 10 point review of systems is negative except as listed above in HPI.  SpO2 100 %.  Secondary Survey:  GCS: E(1)//V(1)//M(1) Constitutional: well-developed, well-nourished, appears older than stated age and jaundiced Skull: normocephalic, atraumatic Eyes: pupils equal, round, sluggish reaction to light, 25mm b/l, moist conjunctiva Face/ENT: midface stable without deformity, poor  dentition, external inspection of ears and nose normal, hearing unable to be assessed  Oropharynx: normal oropharyngeal mucosa, no blood, intubated in TB Neck: no thyromegaly, trachea midline, c-collar in place on arrival, unable to assess midline cervical tenderness to palpation, no C-spine stepoffs Chest: breath sounds equal bilaterally, no  respiratory effort (s/p rocuronium), no midline or lateral chest wall deformity Abdomen: soft, NT, no bruising, no hepatosplenomegaly FAST: not performed Pelvis: stable GU: normal female genitalia Back: circular wound at sacrum, no T/L spine stepoffs Rectal:  no tone (s/p roc) Extremities: 2+  radial and pedal pulses bilaterally, unable to assess motor and sensation of bilateral UE and LE, no peripheral edema MSK: unable to assess gait/station, no clubbing/cyanosis of fingers/toes, unable to assess ROM of all four extremities Skin: warm, dry, no rashes  CXR in TB: ETT a little deep, retracted ~2cm  Pelvis XR in TB: unremarkable  Procedures in TB: intubation    Assessment/Plan: Problem List Fall out of bed  Plan R SDH with 26mm midline shift, IPH, L SDH (small), B-SAH, sz en route - NSGY c/s, Dr. Arnoldo Morale, notified at 1256, keppra for sz ppx Suspected EtOH abuse - liver appears cirrhotic, also has  splenomegaly. checking LFTs/TEG, give TXA; start CIWA protocol ID - vanc/cefepime given in ED FEN - NPO DVT - SCDs, hold chemical ppx  Dispo - Admit to inpatient--ICU  Critical care time: 31min  Jesusita Oka, MD General and [facility]

## 2021-09-27 NOTE — Progress Notes (Signed)
1730: Called and spoke with pharmacist Earle Reome regarding patient's left arm noted to be pale and very cold with no palpable pulse where two vasopressors were running in a single peripheral IV at admission to 4N ICU. Per pharmacist, no recommendations or orders at this point in time.   1800: MD Lovick at bedside and aware of patient's left arm status. Patient obtaining central line access at this time.

## 2021-09-27 NOTE — ED Provider Notes (Signed)
[facility] EMERGENCY DEPARTMENT Provider Note   CSN: [id number] Arrival date & time: 09/22/2021  1224     History  No chief complaint on file.   Jill Farmer is a 41 y.o. female.  HPI Patient presents initially as a level 2 trauma, but is upgraded soon thereafter to a level 1 due to altered mental status.  Level 5 caveat secondary to acuity of condition.  History is obtained by EMS providers.  Per report the patient has not been acting like her self for several days.  Today she was found on the ground after a fall from her bed.  Patient was found by her mother.  Per EMS the patient was essentially noninteractive in route, had 1 seizure prior to ED arrival requiring diazepam.  No obvious trauma per EMS.  Patient has a known history of alcohol abuse, cirrhosis, no seizure history aside from 1 episode with withdrawal in the past. Per EMS the patient was hypoxic into the 50s after the seizure activity this improved with supplemental oxygen. Per EMS the patient has been febrile as well.     Home Medications Prior to Admission medications   Medication Sig Start Date End Date Taking? Authorizing Provider  chlordiazePOXIDE (LIBRIUM) 25 MG capsule 50mg  PO TID x 1D, then 25-50mg  PO BID X 1D, then 25-50mg  PO QD X 1D 04/25/21   Deno Etienne, DO  furosemide (LASIX) 20 MG tablet TAKE 1 TABLET (20 MG TOTAL) BY MOUTH DAILY. Patient not taking: Reported on 04/25/2021 08/23/20 08/23/21  Darliss Cheney, MD  Multiple Vitamin (MULTIVITAMIN WITH MINERALS) TABS tablet Take 1 tablet by mouth daily.    [provider]  ondansetron (ZOFRAN) 8 MG tablet Take 8 mg by mouth 2 (two) times daily as needed for nausea/vomiting. 04/09/21   [provider]  spironolactone (ALDACTONE) 100 MG tablet TAKE 1 TABLET (100 MG TOTAL) BY MOUTH DAILY. Patient not taking: Reported on 04/25/2021 08/23/20 08/23/21  Darliss Cheney, MD      Allergies    Aspirin, Ibuprofen, Nsaids, Sulfa antibiotics, and  Acetaminophen    Review of Systems   Review of Systems  Unable to perform ROS: Acuity of condition   Physical Exam Updated Vital Signs BP (!) 108/47    Pulse (!) 132    Temp 99.8 F (37.7 C) (Axillary)    Resp 18    Ht 5\' 7"  (1.702 m)    Wt 65.8 kg    SpO2 (!) 83%    BMI 22.71 kg/m  Physical Exam Vitals and nursing note reviewed.  Constitutional:      Appearance: She is well-developed. She is ill-appearing.     Comments: Listless adult female responding only to painful stimuli, no blink response  HENT:     Head: Normocephalic.   Eyes:     Conjunctiva/sclera: Conjunctivae normal.   Cardiovascular:     Rate and Rhythm: Normal rate and regular rhythm.  Pulmonary:     Effort: Pulmonary effort is normal. No respiratory distress.     Breath sounds: Normal breath sounds. No stridor.  Abdominal:     General: There is no distension.  Skin:    General: Skin is warm and dry.     Coloration: Skin is jaundiced.       Neurological:     Comments: Flaccid moves to painful stimuli  Psychiatric:        Cognition and Memory: Cognition is impaired. Memory is impaired.    ED Results / Procedures /  Treatments   Labs (all labs ordered are listed, but only abnormal results are displayed) Labs Reviewed  I-STAT CHEM 8, ED - Abnormal; Notable for the following components:      Result Value   Glucose, Bld 105 (*)    Calcium, Ion 0.98 (*)    TCO2 9 (*)    Hemoglobin 11.2 (*)    HCT 33.0 (*)    All other components within normal limits  CBG MONITORING, ED - Abnormal; Notable for the following components:   Glucose-Capillary 105 (*)    All other components within normal limits  RESP PANEL BY RT-PCR (FLU A&B, COVID) ARPGX2  COMPREHENSIVE METABOLIC PANEL  CBC  ETHANOL  URINALYSIS, ROUTINE W REFLEX MICROSCOPIC  LACTIC ACID, PLASMA  PROTIME-INR  LIPASE, BLOOD  HIV ANTIBODY (ROUTINE TESTING W REFLEX)  TRAUMA TEG PANEL  SAMPLE TO BLOOD BANK    EKG None  Radiology CT Head Wo  Contrast  Result Date: 09/24/2021 CLINICAL DATA:  Head trauma, moderate to severe, mental status change. EXAM: CT HEAD WITHOUT CONTRAST TECHNIQUE: Contiguous axial images were obtained from the base of the skull through the vertex without intravenous contrast. COMPARISON:  CT head dated April 25, 2021 FINDINGS: Brain: There is heterogeneous collection in the right extra-axial space likely representing acute subdural hemorrhage in the background of prior subacute subdural hemorrhage. This hemorrhage at the level of the right temporal lobe measures up to 1.5 cm. There is a small intracerebral hemorrhage in the right frontal lobe measuring 0 0.7 x 0.9 x 0.9 cm. There is mass effect with approximately 5 mm right-to-left midline shift. There is also multiple foci of subarachnoid hemorrhage in the left high frontal lobe. Trace amount of hemorrhage along the posterior falx as well as along the tentorium. Vascular: No hyperdense vessel or unexpected calcification. Skull: Right midline frontal scalp hematoma without evidence of calvarial fracture. Sinuses/Orbits: Patchy opacification of the ethmoid air cells. Mucosal thickening of the left maxillary sinus. The remaining paranasal sinuses and mastoid air cells are clear. Other: None. IMPRESSION: 1. Acute right cerebral convexity hemorrhage, likely in the background of prior subacute subdural hemorrhage with edema and approximately 5 mm right-to-left midline shift. There is also small right intra parenchymal hemorrhage in the right frontal lobe measuring 0.7 x 0.9 x 0.9 cm. Multiple foci of subarachnoid hemorrhage in the high left frontal lobe. Trace amount of hemorrhage along the posterior falx cerebri as well as along the tentorium. 2. Midline frontal scalp hematoma without evidence of calvarial fracture. Electronically Signed   By: Keane Police D.O.   On: 10/04/2021 13:21   CT Cervical Spine Wo Contrast  Result Date: 09/25/2021 CLINICAL DATA:  Fall. EXAM: CT CERVICAL  SPINE WITHOUT CONTRAST TECHNIQUE: Multidetector CT imaging of the cervical spine was performed without intravenous contrast. Multiplanar CT image reconstructions were also generated. COMPARISON:  03/31/2020 FINDINGS: Alignment: Normal. Skull base and vertebrae: No acute fracture. No primary bone lesion or focal pathologic process. Soft tissues and spinal canal: No prevertebral fluid or swelling. No visible canal hematoma. Disc levels: Disc space narrowing and endplate spurring noted at C5-6 and C6-7. Upper chest: Negative. Other: None IMPRESSION: 1. No evidence for cervical spine fracture. 2. Cervical degenerative disc disease. Electronically Signed   By: Kerby Moors M.D.   On: 09/26/2021 13:36   DG Pelvis Portable  Result Date: 09/23/2021 CLINICAL DATA:  Trauma, fall, seizure, head injury EXAM: PORTABLE PELVIS 1-2 VIEWS COMPARISON:  None. FINDINGS: There is no evidence of displaced pelvic fracture  or diastasis. No pelvic bone lesions are seen. IMPRESSION: No displaced fracture or dislocation of the pelvis. Electronically Signed   By: Delanna Ahmadi M.D.   On: 09/29/2021 12:57   CT CHEST ABDOMEN PELVIS W CONTRAST  Result Date: 10/07/2021 CLINICAL DATA:  Abdominal trauma.  Reportedly fell from bed. EXAM: CT CHEST, ABDOMEN, AND PELVIS WITH CONTRAST TECHNIQUE: Multidetector CT imaging of the chest, abdomen and pelvis was performed following the standard protocol during bolus administration of intravenous contrast. CONTRAST:  126mL OMNIPAQUE IOHEXOL 350 MG/ML SOLN COMPARISON:  CT AP 08/21/2020 FINDINGS: CT CHEST FINDINGS Cardiovascular: Normal heart size.  No pericardial effusion. Mediastinum/Nodes: Soft tissue stranding is identified within the left lower cervical and left supraclavicular region. ET tube tip is above the carina. There is a NG tube with tip in the stomach. No mediastinal or hilar adenopathy. Lungs/Pleura: No pleural effusion or pneumothorax. Ground-glass and airspace density within the left  upper lobe measures 2.6 cm, image 62/5 and may represent either acute pulmonary contusion or focal area of pneumonia. Several small lung nodules are noted within the periphery of the left upper lobe which measure up to 3 mm, image 54/5. Dependent changes are noted overlying the posterior lungs. Musculoskeletal: Subacute to chronic left seventh rib fracture, image 119/5. No acute osseous findings identified. CT ABDOMEN PELVIS FINDINGS Hepatobiliary: Severe changes of nodular cirrhosis with marked diffuse fatty infiltration. These changes diminish sensitivity for detecting subtle liver injury. Within this limitation there is no convincing evidence for a liver laceration or contusion. Gallstones identified within the gallbladder. No signs of bile duct dilatation. Pancreas: Unremarkable. No pancreatic ductal dilatation or surrounding inflammatory changes. Spleen: The spleen is enlarged measuring 13.3 cm in cranial caudal dimension. No signs of splenic injury or perisplenic hematoma. Adrenals/Urinary Tract: Adrenal glands are normal. No signs of adrenal hemorrhage or renal injury. Bladder unremarkable. Stomach/Bowel: Enteric tube tip is in the body of stomach. Stomach appears decompressed. Small bowel loops are unremarkable. Diffuse edema involving the colonic wall is identified which in the setting of advanced cirrhosis with portal venous hypertension is nonspecific. This may reflect portal hypertensive colopathy versus inflammatory/infectious colitis. Vascular/Lymphatic: There is a normal caliber abdominal aorta. The right portal vein is not visualized, similar to previous exam and is likely chronically occluded. Signs of partial cavernous transformation noted. Recanalization of the umbilical vein which appears markedly enlarged with extensive collaterals extending along the undersurface of the ventral abdominal wall. No abdominopelvic adenopathy. Reproductive: The endometrium appears mildly thickened measuring 1.9 cm  and AP dimension, image 58/7. No adnexal mass noted. Other: Trace ascites within the abdomen and pelvis. Musculoskeletal: Chronic bilateral L5 pars defects identified. No acute or suspicious osseous findings identified. IMPRESSION: 1. Ground-glass and airspace density within the left upper lobe may represent either acute pulmonary contusion or focal area of pneumonia. 2. Subacute to chronic left seventh rib fracture. 3. Morphologic features of the liver compatible with advanced cirrhosis and portal venous hypertension. These changes diminish sensitivity for detecting subtle liver injury. Within this limitation there is no convincing evidence for a liver laceration or contusion. 4. 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 5. Diffuse edema involving the colonic wall is identified which in the setting of advanced cirrhosis with portal venous hypertension is nonspecific. This may reflect portal hypertensive colopathy versus inflammatory/infectious colitis. 6. Mild thickening of the endometrium. Consider further evaluation with pelvic sonogram. 7. Gallstones. 8. Soft tissue stranding within the left lower cervical and left supraclavicular region. Findings are  likely the sequelae of soft tissue injury in the setting of acute fall. No underlying clavicle fracture identified. 9. Aortic Atherosclerosis (ICD10-I70.0). Electronically Signed   By: Kerby Moors M.D.   On: 10/13/2021 13:32   DG CHEST PORT 1 VIEW  Result Date: 10/09/2021 CLINICAL DATA:  Central line placement. EXAM: PORTABLE CHEST 1 VIEW COMPARISON:  September 27, 2021 at 12:34 p.m. FINDINGS: A new left central line terminates in the SVC. No pneumothorax. The ETT is in good position. The NG tube terminates below today's film. The cardiomediastinal silhouette is stable. Probable small left effusion. Increasing opacity in the bases, left greater than right. IMPRESSION: 1. Support apparatus as above. No pneumothorax after  central line placement. 2. Increasing haziness in the bases. Probable small left pleural effusion. 3. No other acute interval changes. Electronically Signed   By: Dorise Bullion III M.D.   On: 10/18/2021 18:45   DG Chest Port 1 View  Result Date: 10/05/2021 CLINICAL DATA:  Trauma, fall, seizure, head injury EXAM: PORTABLE CHEST 1 VIEW COMPARISON:  08/21/2020 FINDINGS: Endotracheal intubation, tip just above the carina, approximately 1 cm. Mild cardiomegaly. No acute airspace opacity. Minimally displaced fracture of the left seventh rib. IMPRESSION: 1. Endotracheal intubation, tip just above the carina, approximately 1 cm. Consider slight retraction to mid tracheal position. 2. No acute airspace opacity. 3. Minimally displaced fracture of the left seventh rib. No associated pneumothorax. Electronically Signed   By: Delanna Ahmadi M.D.   On: 10/16/2021 12:54    Procedures Procedures    INTUBATION Performed by: Carmin Muskrat  Required items: required blood products, implants, devices, and special equipment available Patient identity confirmed: provided demographic data and hospital-assigned identification number Time out: Immediately prior to procedure a "time out" was called to verify the correct patient, procedure, equipment, support staff and site/side marked as required.  Indications: airway protection  Intubation method: Glidescope Laryngoscopy   Preoxygenation: BVM  Sedatives: 20Etomidate Paralytic: 100 Rocuronium  Tube Size: 7.5 cuffed  Post-procedure assessment: chest rise and ETCO2 monitor Breath sounds: equal and absent over the epigastrium Tube secured with: ETT holder Chest x-ray interpreted by radiologist and me.  Chest x-ray findings: endotracheal tube in appropriate position  Patient tolerated the procedure well with no immediate complications.    Medications Ordered in ED Medications  ceFEPIme (MAXIPIME) 2 g in sodium chloride 0.9 % 100 mL IVPB (has no  administration in time range)  vancomycin (VANCOCIN) IVPB 1000 mg/200 mL premix (has no administration in time range)  oxyCODONE (Oxy IR/ROXICODONE) immediate release tablet 5-10 mg (has no administration in time range)  morphine 4 MG/ML injection 4 mg (has no administration in time range)  docusate sodium (COLACE) capsule 100 mg (has no administration in time range)  ondansetron (ZOFRAN-ODT) disintegrating tablet 4 mg (has no administration in time range)    Or  ondansetron (ZOFRAN) injection 4 mg (has no administration in time range)  0.9 %  sodium chloride infusion (has no administration in time range)  levETIRAcetam (KEPPRA) IVPB 500 mg/100 mL premix (has no administration in time range)  methocarbamol (ROBAXIN) tablet 1,000 mg (has no administration in time range)  tranexamic acid (CYKLOKAPRON) IVPB 1,000 mg (has no administration in time range)    Followed by  tranexamic acid (CYKLOKAPRON) 1,000 mg in sodium chloride 0.9 % 500 mL infusion (has no administration in time range)  sugammadex sodium (BRIDION) injection 950 mg (has no administration in time range)  iohexol (OMNIPAQUE) 350 MG/ML injection 100 mL (100 mLs Intravenous  Contrast Given 10/18/2021 1305)    ED Course/ Medical Decision Making/ A&P                          As above with concern for altered mental status following possible fall, no knowledge of the patient's recent description of being different as well she was designated as a level 1 trauma.  With concern for airway protection she required intubation.  This was accomplished first pass without complication by myself, see procedure note.  I reviewed the bedside x-ray, ET tube was withdrawn 1.5 cm. Subsequently patient had stat labs, imaging ordered.  Review of bedside pelvis x-ray unremarkable.  However, stat CT notable for subdural hematoma, substantial, right-sided.  On repeat exam patient has sluggish pupillary response, 7 mm bilaterally. Additional considerations including  sepsis versus progression of liver disease versus peritonitis patient received broad-spectrum antibiotics after consultation with pharmacy.  Neurosurgery aware via consultation with our trauma surgeon, with whom I coordinated patient's resuscitation, interventions, and care. After independently reviewing and interpreting CTs, x-rays, labs patient required admission for monitoring, management.  Labs was notable for lactic acidosis 9, substantial hepatic dysfunction with a bilirubin also of 9.  Patient's initial resuscitation beyond intubation included fluids. For emergent decompression I discussed with the patient's presentation with her mother via telephone, with her neurosurgeon, together we obtained consent from the mother for the patient was unable to provide herself for this emergent procedure.       Final Clinical Impression(s) / ED Diagnoses Final diagnoses:  Trauma  SDH (subdural hematoma)  Encounter for central line placement  Hepatic dysfunction  CRITICAL CARE Performed by: Carmin Muskrat Total critical care time: 45 minutes Critical care time was exclusive of separately billable procedures and treating other patients. Critical care was necessary to treat or prevent imminent or life-threatening deterioration. Critical care was time spent personally by me on the following activities: development of treatment plan with patient and/or surrogate as well as nursing, discussions with consultants, evaluation of patient's response to treatment, examination of patient, obtaining history from patient or surrogate, ordering and performing treatments and interventions, ordering and review of laboratory studies, ordering and review of radiographic studies, pulse oximetry and re-evaluation of patient's condition.    Carmin Muskrat, MD 10/12/2021 2013

## 2021-09-27 NOTE — ED Notes (Signed)
Consent for craniotomy obtained via phone at this time. Mother of pt provided consent. Consent obtained by Dr. Arnoldo Morale, witnessed by this RN and Dr. Vanita Panda.

## 2021-09-27 NOTE — ED Notes (Signed)
Dr. Arnoldo Morale aware of BP 86/40. Per Dr. Arnoldo Morale, take to OR.

## 2021-09-27 NOTE — Anesthesia Procedure Notes (Signed)
Arterial Line Insertion Start/End01/15/2023 2:40 PM, 09/30/2021 2:50 PM Performed by: Murvin Natal, MD, anesthesiologist  Patient location: OR. Preanesthetic checklist: patient identified, IV checked, site marked, risks and benefits discussed, surgical consent, monitors and equipment checked, pre-op evaluation, timeout performed and anesthesia consent Patient sedated Left, radial was placed Catheter size: 20 G Hand hygiene performed , maximum sterile barriers used  and Seldinger technique used  Attempts: 1 (Previous attempts by CRNA) Procedure performed using ultrasound guided technique. Ultrasound Notes:anatomy identified, needle tip was noted to be adjacent to the nerve/plexus identified and no ultrasound evidence of intravascular and/or intraneural injection Following insertion, dressing applied and Biopatch. Post procedure assessment: normal and unchanged  Post procedure complications: second provider assisted. Patient tolerated the procedure well with no immediate complications.

## 2021-09-27 NOTE — Transfer of Care (Signed)
Immediate Anesthesia Transfer of Care Note  Patient: Jill Farmer  Procedure(s) Performed: CRANIOTOMY FOR SUBDURAL HEMATOMA EVACUATION (Right: Head)  Patient Location: PACU  Anesthesia Type:General  Level of Consciousness: sedated and Patient remains intubated per anesthesia plan  Airway & Oxygen Therapy: Patient remains intubated per anesthesia plan and Patient placed on Ventilator (see vital sign flow sheet for setting)  Post-op Assessment: Report given to RN and Post -op Vital signs reviewed and unstable, Anesthesiologist notified  Post vital signs: Reviewed, stable and unstable  Last Vitals:  Vitals Value Taken Time  BP 77/44 10/11/2021 1742  Temp 36.8 C 09/25/2021 1743  Pulse 113 10/10/2021 1743  Resp 27 10/01/2021 1743  SpO2 100 % 10/21/2021 1743  Vitals shown include unvalidated device data.  Last Pain:  Vitals:   10/06/2021 1231  TempSrc: Axillary         Complications: No notable events documented.

## 2021-09-27 NOTE — Anesthesia Procedure Notes (Addendum)
Central Venous Catheter Insertion Performed by: Murvin Natal, MD, anesthesiologist Start/End01/11/2021 5:40 PM, 10/13/2021 5:50 PM Patient location: ICU. Preanesthetic checklist: patient identified, IV checked, surgical consent, monitors and equipment checked, pre-op evaluation and timeout performed Position: supine Patient sedated Hand hygiene performed , maximum sterile barriers used  and Seldinger technique used Catheter size: 8 Fr Total catheter length 16. Central line was placed.Double lumen Procedure performed using ultrasound guided technique. Ultrasound Notes:anatomy identified, needle tip was noted to be adjacent to the nerve/plexus identified, no ultrasound evidence of intravascular and/or intraneural injection and image(s) printed for medical record Attempts: 1 Following insertion, dressing applied, line sutured and Biopatch. Post procedure assessment: blood return through all ports, free fluid flow and no air  Patient tolerated the procedure well with no immediate complications.

## 2021-09-27 NOTE — Progress Notes (Signed)
Patient seen and examined. Has rec'd plts and 1 of 2 ordered units of pRBC. TEG resulted with coagulopathy: 3rd u pRBC, 2u FFP, 1u cryo ordered. No external signs of bleeding, abdomen soft, surgical site without bleeding, no blood PR/NG, extremities soft and compressible although LUE more full than R (suspect IV infiltrate earlier). K/Mg ordered. CK noted, will trend. UOP 75cc since arriving post-op, acceptable, but appears to be developing AKI. ABG with profound metabolic acidosis and worsening based deficit. 3 amps bicarb ordered. FiO2 lowered to 40%. On levo 30, vaso 0.04, epi 6. Repeat labs at [facility]: ABG/CBC/CMP/TEG/CK.   Additional critical care time: 73min  Jesusita Oka, MD General and [facility]

## 2021-09-27 NOTE — Progress Notes (Signed)
°  Transition of Care [facility]) Screening Note   Patient Details  Name: Jill Farmer Date of Birth: Oct 14, 1980   Transition of Care [facility]) CM/SW Contact:    Alfredia Ferguson, LCSW Phone Number: 09/23/2021, 1:21 PM    Transition of Care Department [facility]) has reviewed patient and added substance use resources to patient's AVS. We will continue to monitor patient advancement through interdisciplinary progression rounds as her medical course progresses. TOC will continue to follow.

## 2021-09-27 NOTE — Progress Notes (Signed)
Transition of Care [facility]) - CAGE-AID Screening   Patient Details  Name: Jill Farmer MRN: [id number] Date of Birth: 1981-07-25    Elvina Sidle, RN Trauma Response Nurse Phone Number: [phone] 10/09/2021, 3:08 PM    CAGE-AID Screening: Substance Abuse Screening unable to be completed due to: : Patient unable to participate (pt is intubated/in OR. Has an extensive ETOH history. CIWA is ordered)

## 2021-09-27 NOTE — Discharge Instructions (Signed)
                  Intensive Outpatient Programs  [facility]    [facility] [address]     [address] [place],  [place]     [place], [place] [phone]      [phone]  [facility]   [facility]  (Inpatient and outpatient)  [phone] (Suboxone and Methadone) [address]           [phone]           [facility]    [facility] - Intensive Outpatient [address]     [address] [place], [place] [address]     [place], [place]  [phone]      [phone]  [facility] (Outpatient, Inpatient, [facility]  [facility] (Groups and Residental) (insurance only) [phone]    [place], [place]          [phone]       [facility]    [facility] (for caregivers and family) [address]     [address] [place], [place]     [place], [place] [phone]      [phone]  Residential Treatment Programs  [facility]  [facility](2 years) Residential: 90 days)  [facility] [address]      [address] [place], [place]     [place], [place] [phone]      [phone] or [phone]  [facility]    [facility] [address]      [address] [place], [place]     [place], [place] [phone]      [phone]  [facility]   [facility] ([facility]) [address]     [address] [place], [place] [address]     [place], [place] [phone]      [phone] Admissions: 8am-3pm M-F  [facility]: Residential Program (90 Days)              [facility]: [facility]  [place], [place]     [place], [place]  [phone] or [phone]    (Walk in Hours over the weekend or by referral)   [facility]: [facility]:[phone] (for crisis  response 24 hours a day) 

## 2021-09-27 NOTE — Progress Notes (Signed)
°   10/19/2021 1222  Clinical Encounter Type  Visited With Patient not available  Visit Type Initial;Trauma  Referral From Nurse  Consult/Referral To Chaplain    Chaplain Baker Janus responded to page. Chaplain received an update from the nurse outside of the patient's room. The patient is being attended to by the medical team. There are no support person present. Chaplain remains available for additional support if needed. This note was prepared by Jeanine Luz, M.Div..  For questions please contact by phone [phone].

## 2021-09-27 NOTE — Progress Notes (Signed)
Patient transported to OR. RN at bedside.

## 2021-09-27 NOTE — Progress Notes (Signed)
Pharmacy Antibiotic Note  Jill Farmer is a 41 y.o. 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  Pharmacy has been consulted for cefepime and vancomycin dosing for possible sepsis.  Patient currently afebrile, tmax 99.8. Wbc elevated at 19.6. Scr slightly elevated at 1.4 this evening (was normal earlier today). Patient now s/p craniotomy for evaculation of SDH.   Vancomycin 1250 mg IV Q 24 hrs. Goal AUC 400-550. Expected AUC: 543 SCr used: 1.4   Plan: Vancomycin 1250 IV every 24 hours.  Goal trough 15-20 mcg/mL. Cefepime 2g q12  Height: 5\' 7"  (170.2 cm) Weight: 65.8 kg (145 lb) IBW/kg (Calculated) : 61.6  Temp (24hrs), Avg:98.6 F (37 C), Min:97.6 F (36.4 C), Max:99.8 F (37.7 C)  Recent Labs  Lab 10/11/2021 1245 10/09/2021 1257  WBC 10.0  --   CREATININE 1.22* 0.90  LATICACIDVEN >9.0*  --     Estimated Creatinine Clearance: 82.4 mL/min (by C-G formula based on SCr of 0.9 mg/dL).    Allergies  Allergen Reactions   Aspirin Anaphylaxis   Ibuprofen Anaphylaxis   Nsaids Anaphylaxis    Other reaction(s): anaphylaxis   Sulfa Antibiotics Other (See Comments)    Unknown Unknown-states she was advised by her mom not to take it due to an incident when she was 7.   Acetaminophen Other (See Comments)    Liver Issues    Thank you for allowing pharmacy to be a part of this patients care.  Erin Hearing PharmD., BCPS Clinical Pharmacist 10/18/2021 6:40 PM

## 2021-09-27 NOTE — Progress Notes (Signed)
Patient arrived to unit with nonfunctioning PIV's in left arm with two pressors infusing. Arm pale, cold, no palpable radial pulse. Medications stopped. Anesthesia called by CRNA at bedside to place central line stat. Dr. Bobbye Morton also paged and made aware and will be at bedside stat.

## 2021-09-27 NOTE — Anesthesia Preprocedure Evaluation (Addendum)
Anesthesia Evaluation  Patient identified by MRN, date of birth, ID band Patient unresponsive    Reviewed: Allergy & Precautions, Patient's Chart, lab work & pertinent test resultsPreop documentation limited or incomplete due to emergent nature of procedure.  Airway Mallampati: Intubated       Dental   Pulmonary  Intubated       + intubated    Cardiovascular  Rhythm:Regular Rate:Tachycardia     Neuro/Psych Seizures -,     GI/Hepatic (+) Cirrhosis     substance abuse  alcohol use,   Endo/Other    Renal/GU      Musculoskeletal   Abdominal   Peds  Hematology  (+) anemia ,   Anesthesia Other Findings Subdural hematoma  Reproductive/Obstetrics                           Anesthesia Physical Anesthesia Plan  ASA: 4 and emergent  Anesthesia Plan: General   Post-op Pain Management:    Induction: Intravenous and Inhalational  PONV Risk Score and Plan: 3 and Ondansetron, Dexamethasone and Treatment may vary due to age or medical condition  Airway Management Planned: Oral ETT  Additional Equipment:   Intra-op Plan:   Post-operative Plan: Post-operative intubation/ventilation  Informed Consent:     Only emergency history available  Plan Discussed with: CRNA and Surgeon  Anesthesia Plan Comments: (Intubated)      Anesthesia Quick Evaluation

## 2021-09-27 NOTE — ED Notes (Signed)
Handoff provided to OR at this time.

## 2021-09-27 NOTE — Procedures (Signed)
° °  Procedure Note  Date: 09/29/2021  Procedure: central venous catheter placement--right, femoral vein, with ultrasound guidance  Pre-op diagnosis: hypotension, need for administration of vasopressors Post-op diagnosis: same  Surgeon: Jesusita Oka, MD  EBL: <5cc Drains/Implants:  triple lumen central venous catheter  Description of procedure: Time-out was performed verifying correct patient, procedure, site, laterality, and signature of informed consent. The right groin was prepped and draped in the usual sterile fashion. The right femoral vein was localized with ultrasound guidance. Five ccs of local anesthetic was infiltrated at the site of venous access. The right femoral vein was accessed using an introducer needle and a guidewire passed through the needle. The needle was removed and a skin nick was made. The tract was dilated and the central venous catheter advanced over the guidewire followed by removal of the guidewire. All ports drew blood easily and all were flushed with saline. The catheter was secured to the skin with suture and a sterile dressing. The patient tolerated the procedure well. There were no immediate complications.    Jesusita Oka, MD General and [facility]

## 2021-09-27 NOTE — Progress Notes (Signed)
Patient transported to CT and back without complications. RN at bedside.  

## 2021-09-27 NOTE — Anesthesia Postprocedure Evaluation (Signed)
Anesthesia Post Note  Patient: Jill Farmer  Procedure(s) Performed: CRANIOTOMY FOR SUBDURAL HEMATOMA EVACUATION (Right: Head)     Patient location during evaluation: SICU Anesthesia Type: General Level of consciousness: sedated Pain management: pain level controlled Vital Signs Assessment: post-procedure vital signs reviewed and stable Respiratory status: patient remains intubated per anesthesia plan Cardiovascular status: stable Postop Assessment: no apparent nausea or vomiting Anesthetic complications: no   No notable events documented.  Last Vitals:  Vitals:   10/07/2021 1845 10/11/2021 1855  BP: (!) 70/51 (!) 103/56  Pulse: (!) 125 (!) 122  Resp: (!) 28 (!) 28  Temp: (!) 35.9 C (!) 35.8 C  SpO2: 96% 99%    Last Pain:  Vitals:   10/08/2021 1855  TempSrc: Bladder                 Jill Farmer

## 2021-09-27 NOTE — ED Notes (Signed)
Dr. Arnoldo Morale, neurosurgery, at bedside assessing pt.

## 2021-09-27 NOTE — ED Notes (Signed)
Per OR, ready for pt. Santiago Glad TRN calling RT to assist with transport.

## 2021-09-27 NOTE — ED Triage Notes (Signed)
Pt BIB [facility] from home where she was found by her mother on the ground unresponsive beside her bed. Per EMS, mother reports pt "acting different" for several days. Hx cirrhosis d/t ETOH. Per EMS, pt had seizure en route. Nasal trumpet in place on arrival, EMS provided blow-by O2 via BVM.

## 2021-09-28 LAB — PREPARE FRESH FROZEN PLASMA
Unit division: 0
Unit division: 0

## 2021-09-28 LAB — BPAM FFP
Blood Product Expiration Date: 202301122359
Blood Product Expiration Date: 202301122359
ISSUE DATE / TIME: 202301072251
ISSUE DATE / TIME: 202301072251
Unit Type and Rh: 1700
Unit Type and Rh: 7300

## 2021-09-28 LAB — PREPARE CRYOPRECIPITATE: Unit division: 0

## 2021-09-28 LAB — POCT I-STAT 7, (LYTES, BLD GAS, ICA,H+H)
Acid-base deficit: 25 mmol/L — ABNORMAL HIGH (ref 0.0–2.0)
Bicarbonate: 5.3 mmol/L — ABNORMAL LOW (ref 20.0–28.0)
Calcium, Ion: 1.1 mmol/L — ABNORMAL LOW (ref 1.15–1.40)
HCT: 22 % — ABNORMAL LOW (ref 36.0–46.0)
Hemoglobin: 7.5 g/dL — ABNORMAL LOW (ref 12.0–15.0)
O2 Saturation: 93 %
Patient temperature: 98.2
Potassium: 3.4 mmol/L — ABNORMAL LOW (ref 3.5–5.1)
Sodium: 140 mmol/L (ref 135–145)
TCO2: 6 mmol/L — ABNORMAL LOW (ref 22–32)
pCO2 arterial: 23.7 mmHg — ABNORMAL LOW (ref 32.0–48.0)
pH, Arterial: 6.954 — CL (ref 7.350–7.450)
pO2, Arterial: 105 mmHg (ref 83.0–108.0)

## 2021-09-28 LAB — COMPREHENSIVE METABOLIC PANEL
ALT: 30 U/L (ref 0–44)
AST: 141 U/L — ABNORMAL HIGH (ref 15–41)
Albumin: 2.5 g/dL — ABNORMAL LOW (ref 3.5–5.0)
Alkaline Phosphatase: 94 U/L (ref 38–126)
BUN: 12 mg/dL (ref 6–20)
CO2: <7 mmol/L — ABNORMAL LOW (ref 22–32)
Calcium: 8.9 mg/dL (ref 8.9–10.3)
Chloride: 103 mmol/L (ref 98–111)
Creatinine, Ser: 1.71 mg/dL — ABNORMAL HIGH (ref 0.44–1.00)
GFR, Estimated: 39 mL/min — ABNORMAL LOW (ref >60)
Glucose, Bld: 58 mg/dL — ABNORMAL LOW (ref 70–99)
Potassium: 3.5 mmol/L (ref 3.5–5.1)
Sodium: 142 mmol/L (ref 135–145)
Total Bilirubin: 7.6 mg/dL — ABNORMAL HIGH (ref 0.3–1.2)
Total Protein: 4.6 g/dL — ABNORMAL LOW (ref 6.5–8.1)

## 2021-09-28 LAB — BPAM CRYOPRECIPITATE
Blood Product Expiration Date: 202301080415
ISSUE DATE / TIME: 202301072251
Unit Type and Rh: 5100

## 2021-09-28 LAB — CBC
HCT: 26.3 % — ABNORMAL LOW (ref 36.0–46.0)
HCT: 19.9 % — ABNORMAL LOW (ref 36.0–46.0)
Hemoglobin: 8 g/dL — ABNORMAL LOW (ref 12.0–15.0)
Hemoglobin: 6.2 g/dL — CL (ref 12.0–15.0)
MCH: 31 pg (ref 26.0–34.0)
MCH: 32.3 pg (ref 26.0–34.0)
MCHC: 30.4 g/dL (ref 30.0–36.0)
MCHC: 31.2 g/dL (ref 30.0–36.0)
MCV: 101.9 fL — ABNORMAL HIGH (ref 80.0–100.0)
MCV: 103.6 fL — ABNORMAL HIGH (ref 80.0–100.0)
Platelets: 83 10*3/uL — ABNORMAL LOW (ref 150–400)
Platelets: 52 10*3/uL — ABNORMAL LOW (ref 150–400)
RBC: 2.58 MIL/uL — ABNORMAL LOW (ref 3.87–5.11)
RBC: 1.92 MIL/uL — ABNORMAL LOW (ref 3.87–5.11)
RDW: 19.1 % — ABNORMAL HIGH (ref 11.5–15.5)
RDW: 19.1 % — ABNORMAL HIGH (ref 11.5–15.5)
WBC: 26.5 10*3/uL — ABNORMAL HIGH (ref 4.0–10.5)
WBC: 19.6 10*3/uL — ABNORMAL HIGH (ref 4.0–10.5)
nRBC: 0.5 % — ABNORMAL HIGH (ref 0.0–0.2)
nRBC: 0.5 % — ABNORMAL HIGH (ref 0.0–0.2)

## 2021-09-28 LAB — TRAUMA TEG PANEL
CFF Max Amplitude: <4 mm — ABNORMAL LOW (ref 15–32)
Citrated Rapid TEG (MA): 42.1 mm — ABNORMAL LOW (ref 52–70)

## 2021-09-28 LAB — PREPARE PLATELET PHERESIS: Unit division: 0

## 2021-09-28 LAB — CK: Total CK: 2332 U/L — ABNORMAL HIGH (ref 38–234)

## 2021-09-28 LAB — BPAM PLATELET PHERESIS
Blood Product Expiration Date: 202301082359
ISSUE DATE / TIME: 202301071845
Unit Type and Rh: 7300

## 2021-09-28 MED ORDER — ALBUMIN HUMAN 5 % IV SOLN
INTRAVENOUS | Status: AC
  Filled 2021-09-28: qty 1000

## 2021-09-28 MED ORDER — BIOTENE DRY MOUTH MT LIQD: 15.0000 mL | OROMUCOSAL | Status: DC | PRN

## 2021-09-28 MED ORDER — ALBUMIN HUMAN 5 % IV SOLN
25.0000 g | Freq: Once | INTRAVENOUS | Status: AC
  Administered 2021-09-28: 25 g via INTRAVENOUS

## 2021-09-28 MED ORDER — HALOPERIDOL LACTATE 5 MG/ML IJ SOLN: 0.5000 mg | INTRAMUSCULAR | Status: DC | PRN

## 2021-09-28 MED ORDER — SODIUM BICARBONATE 8.4 % IV SOLN
100.0000 meq | Freq: Once | INTRAVENOUS | Status: AC
  Administered 2021-09-28: 100 meq via INTRAVENOUS
  Filled 2021-09-28 (×2): qty 100

## 2021-09-28 MED ORDER — GLYCOPYRROLATE 1 MG PO TABS: 1.0000 mg | ORAL_TABLET | ORAL | Status: DC | PRN

## 2021-09-28 MED ORDER — HALOPERIDOL 0.5 MG PO TABS: 0.5000 mg | ORAL_TABLET | ORAL | Status: DC | PRN

## 2021-09-28 MED ORDER — ALBUMIN HUMAN 5 % IV SOLN: 25.0000 g | Freq: Once | INTRAVENOUS | Status: AC

## 2021-09-28 MED ORDER — DEXTROSE 50 % IV SOLN
1.0000 | Freq: Once | INTRAVENOUS | Status: AC
  Administered 2021-09-28: 50 mL via INTRAVENOUS

## 2021-09-28 MED ORDER — DEXTROSE 50 % IV SOLN
INTRAVENOUS | Status: AC
  Filled 2021-09-28: qty 50

## 2021-09-28 MED ORDER — POLYVINYL ALCOHOL 1.4 % OP SOLN: 1.0000 [drp] | Freq: Four times a day (QID) | OPHTHALMIC | Status: DC | PRN

## 2021-09-28 MED ORDER — NOREPINEPHRINE 16 MG/250ML-% IV SOLN
0.0000 ug/min | INTRAVENOUS | Status: DC
  Administered 2021-09-28: 10 ug/min via INTRAVENOUS
  Filled 2021-09-28: qty 250

## 2021-09-28 MED ORDER — SODIUM BICARBONATE 8.4 % IV SOLN
100.0000 meq | Freq: Once | INTRAVENOUS | Status: AC
  Administered 2021-09-28: 100 meq via INTRAVENOUS
  Filled 2021-09-28: qty 100
  Filled 2021-09-28: qty 50

## 2021-09-28 MED ORDER — CHLORHEXIDINE GLUCONATE CLOTH 2 % EX PADS: 6.0000 | MEDICATED_PAD | Freq: Every day | CUTANEOUS | Status: DC

## 2021-09-28 MED ORDER — HALOPERIDOL LACTATE 2 MG/ML PO CONC
0.5000 mg | ORAL | Status: DC | PRN
  Filled 2021-09-28: qty 0.3

## 2021-09-28 MED ORDER — GLYCOPYRROLATE 0.2 MG/ML IJ SOLN: 0.2000 mg | INTRAMUSCULAR | Status: DC | PRN

## 2021-09-28 MED ORDER — THIAMINE HCL 100 MG/ML IJ SOLN
500.0000 mg | Freq: Once | INTRAVENOUS | Status: AC
  Administered 2021-09-28: 500 mg via INTRAVENOUS
  Filled 2021-09-28: qty 5

## 2021-09-29 ENCOUNTER — Encounter (HOSPITAL_COMMUNITY): Payer: Self-pay | Admitting: Neurosurgery

## 2021-09-29 LAB — BPAM RBC
Blood Product Expiration Date: 202301152359
Blood Product Expiration Date: 202301162359
Blood Product Expiration Date: 202301182359
ISSUE DATE / TIME: 202301071847
ISSUE DATE / TIME: 202301071847
ISSUE DATE / TIME: 202301080031
Unit Type and Rh: 1700
Unit Type and Rh: 1700
Unit Type and Rh: 1700

## 2021-09-29 LAB — POCT I-STAT 7, (LYTES, BLD GAS, ICA,H+H)
Acid-base deficit: 20 mmol/L — ABNORMAL HIGH (ref 0.0–2.0)
Acid-base deficit: 24 mmol/L — ABNORMAL HIGH (ref 0.0–2.0)
Bicarbonate: 7.4 mmol/L — ABNORMAL LOW (ref 20.0–28.0)
Bicarbonate: 5.2 mmol/L — ABNORMAL LOW (ref 20.0–28.0)
Calcium, Ion: 0.97 mmol/L — ABNORMAL LOW (ref 1.15–1.40)
Calcium, Ion: 0.88 mmol/L — CL (ref 1.15–1.40)
HCT: 24 % — ABNORMAL LOW (ref 36.0–46.0)
HCT: 19 % — ABNORMAL LOW (ref 36.0–46.0)
Hemoglobin: 8.2 g/dL — ABNORMAL LOW (ref 12.0–15.0)
Hemoglobin: 6.5 g/dL — CL (ref 12.0–15.0)
O2 Saturation: 100 %
O2 Saturation: 99 %
Patient temperature: 35.6
Patient temperature: 98.2
Potassium: 2.9 mmol/L — ABNORMAL LOW (ref 3.5–5.1)
Potassium: 2.9 mmol/L — ABNORMAL LOW (ref 3.5–5.1)
Sodium: 139 mmol/L (ref 135–145)
Sodium: 142 mmol/L (ref 135–145)
TCO2: 8 mmol/L — ABNORMAL LOW (ref 22–32)
TCO2: 6 mmol/L — ABNORMAL LOW (ref 22–32)
pCO2 arterial: 20.5 mmHg — ABNORMAL LOW (ref 32.0–48.0)
pCO2 arterial: 20.7 mmHg — ABNORMAL LOW (ref 32.0–48.0)
pH, Arterial: 7.159 — CL (ref 7.350–7.450)
pH, Arterial: 7.008 — CL (ref 7.350–7.450)
pO2, Arterial: 215 mmHg — ABNORMAL HIGH (ref 83.0–108.0)
pO2, Arterial: 167 mmHg — ABNORMAL HIGH (ref 83.0–108.0)

## 2021-09-29 LAB — TYPE AND SCREEN
ABO/RH(D): B NEG
Antibody Screen: NEGATIVE
Unit division: 0
Unit division: 0
Unit division: 0

## 2021-09-29 MED FILL — Thrombin For Soln 5000 Unit: CUTANEOUS | Qty: 5000 | Status: CN

## 2021-09-29 MED FILL — Thrombin For Soln 5000 Unit: CUTANEOUS | Qty: 5000 | Status: AC

## 2021-10-02 LAB — PROTIME-INR
INR: 7.9 (ref 0.8–1.2)
Prothrombin Time: 66.2 seconds — ABNORMAL HIGH (ref 11.4–15.2)

## 2021-10-22 NOTE — Progress Notes (Signed)
Mother has arrived at bedside. Clinical update provided. In agreement with decision to proceed with comfort care and discontinuation of vasopressors. Fentanyl drip started.  Critical care time: 29min  Jesusita Oka, MD General and [place] [facility]

## 2021-10-22 NOTE — Progress Notes (Signed)
Father and stepmother at bedside. Patient continues to deteriorate. Clinical update provided. Father has been able to reach mother and provide update, she is reportedly on her way to the hospital. Father is interested in pursuing comfort measures, awaiting arrival of mother. Continue current care.   Jesusita Oka, MD General and [facility]

## 2021-10-22 NOTE — Progress Notes (Signed)
Late Entry  Remifentanil gtt brought up with patient from OR, approximately 241ml wasted with Trish RN in stericycle.

## 2021-10-22 NOTE — Death Summary Note (Signed)
DEATH SUMMARY   Patient Details  Name: Jill Farmer MRN: [id number] DOB: [birth year]  Admission/Discharge Information   Admit Date:  10/25/21  Date of Death:  10-26-21  Time of Death:  0550  Length of Stay: 1  Referring Physician: Lennie Odor, PA   Reason(s) for Hospitalization  Fall  Diagnoses  Preliminary cause of death: Fall, subdural hematoma, fulminant hepatic failure Secondary Diagnoses (including complications and co-morbidities):  Principal Problem:   SDH (subdural hematoma) Active Problems:   Subdural hematoma   Brief Hospital Course (including significant findings, care, treatment, and services provided and events leading to death)  Jill Farmer is a 41 y.o. year old female who fell out of bed, sustained a SDH. Underwent emergent R crani. 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 Family elected to transition to comfort care, patient expired.   Pertinent Labs and Studies  Significant Diagnostic Studies CT Head Wo Contrast  Result Date: October 25, 2021 CLINICAL DATA:  Head trauma, moderate to severe, mental status change. EXAM: CT HEAD WITHOUT CONTRAST TECHNIQUE: Contiguous axial images were obtained from the base of the skull through the vertex without intravenous contrast. COMPARISON:  CT head dated April 25, 2021 FINDINGS: Brain: There is heterogeneous collection in the right extra-axial space likely representing acute subdural hemorrhage in the background of prior subacute subdural hemorrhage. This hemorrhage at the level of the right temporal lobe measures up to 1.5 cm. There is a small intracerebral hemorrhage in the right frontal lobe measuring 0 0.7 x 0.9 x 0.9 cm. There is mass effect with approximately 5 mm right-to-left midline shift. There is also multiple foci of subarachnoid hemorrhage in the left high frontal lobe. Trace amount of hemorrhage along the posterior falx as well as along the tentorium. Vascular:  No hyperdense vessel or unexpected calcification. Skull: Right midline frontal scalp hematoma without evidence of calvarial fracture. Sinuses/Orbits: Patchy opacification of the ethmoid air cells. Mucosal thickening of the left maxillary sinus. The remaining paranasal sinuses and mastoid air cells are clear. Other: None. IMPRESSION: 1. Acute right cerebral convexity hemorrhage, likely in the background of prior subacute subdural hemorrhage with edema and approximately 5 mm right-to-left midline shift. There is also small right intra parenchymal hemorrhage in the right frontal lobe measuring 0.7 x 0.9 x 0.9 cm. Multiple foci of subarachnoid hemorrhage in the high left frontal lobe. Trace amount of hemorrhage along the posterior falx cerebri as well as along the tentorium. 2. Midline frontal scalp hematoma without evidence of calvarial fracture. Electronically Signed   By: Keane Police D.O.   On: October 25, 2021 13:21   CT Cervical Spine Wo Contrast  Result Date: Oct 25, 2021 CLINICAL DATA:  Fall. EXAM: CT CERVICAL SPINE WITHOUT CONTRAST TECHNIQUE: Multidetector CT imaging of the cervical spine was performed without intravenous contrast. Multiplanar CT image reconstructions were also generated. COMPARISON:  03/31/2020 FINDINGS: Alignment: Normal. Skull base and vertebrae: No acute fracture. No primary bone lesion or focal pathologic process. Soft tissues and spinal canal: No prevertebral fluid or swelling. No visible canal hematoma. Disc levels: Disc space narrowing and endplate spurring noted at C5-6 and C6-7. Upper chest: Negative. Other: None IMPRESSION: 1. No evidence for cervical spine fracture. 2. Cervical degenerative disc disease. Electronically Signed   By: Kerby Moors M.D.   On: 2021/10/25 13:36   DG Pelvis Portable  Result Date: Oct 25, 2021 CLINICAL DATA:  Trauma, fall, seizure, head injury EXAM: PORTABLE PELVIS 1-2 VIEWS COMPARISON:  None. FINDINGS: There is no evidence of displaced  pelvic fracture or  diastasis. No pelvic bone lesions are seen. IMPRESSION: No displaced fracture or dislocation of the pelvis. Electronically Signed   By: Delanna Ahmadi M.D.   On: 10/12/2021 12:57   CT CHEST ABDOMEN PELVIS W CONTRAST  Result Date: 09/26/2021 CLINICAL DATA:  Abdominal trauma.  Reportedly fell from bed. EXAM: CT CHEST, ABDOMEN, AND PELVIS WITH CONTRAST TECHNIQUE: Multidetector CT imaging of the chest, abdomen and pelvis was performed following the standard protocol during bolus administration of intravenous contrast. CONTRAST:  175mL OMNIPAQUE IOHEXOL 350 MG/ML SOLN COMPARISON:  CT AP 08/21/2020 FINDINGS: CT CHEST FINDINGS Cardiovascular: Normal heart size.  No pericardial effusion. Mediastinum/Nodes: Soft tissue stranding is identified within the left lower cervical and left supraclavicular region. ET tube tip is above the carina. There is a NG tube with tip in the stomach. No mediastinal or hilar adenopathy. Lungs/Pleura: No pleural effusion or pneumothorax. Ground-glass and airspace density within the left upper lobe measures 2.6 cm, image 62/5 and may represent either acute pulmonary contusion or focal area of pneumonia. Several small lung nodules are noted within the periphery of the left upper lobe which measure up to 3 mm, image 54/5. Dependent changes are noted overlying the posterior lungs. Musculoskeletal: Subacute to chronic left seventh rib fracture, image 119/5. No acute osseous findings identified. CT ABDOMEN PELVIS FINDINGS Hepatobiliary: Severe changes of nodular cirrhosis with marked diffuse fatty infiltration. These changes diminish sensitivity for detecting subtle liver injury. Within this limitation there is no convincing evidence for a liver laceration or contusion. Gallstones identified within the gallbladder. No signs of bile duct dilatation. Pancreas: Unremarkable. No pancreatic ductal dilatation or surrounding inflammatory changes. Spleen: The spleen is enlarged measuring 13.3 cm in cranial  caudal dimension. No signs of splenic injury or perisplenic hematoma. Adrenals/Urinary Tract: Adrenal glands are normal. No signs of adrenal hemorrhage or renal injury. Bladder unremarkable. Stomach/Bowel: Enteric tube tip is in the body of stomach. Stomach appears decompressed. Small bowel loops are unremarkable. Diffuse edema involving the colonic wall is identified which in the setting of advanced cirrhosis with portal venous hypertension is nonspecific. This may reflect portal hypertensive colopathy versus inflammatory/infectious colitis. Vascular/Lymphatic: There is a normal caliber abdominal aorta. The right portal vein is not visualized, similar to previous exam and is likely chronically occluded. Signs of partial cavernous transformation noted. Recanalization of the umbilical vein which appears markedly enlarged with extensive collaterals extending along the undersurface of the ventral abdominal wall. No abdominopelvic adenopathy. Reproductive: The endometrium appears mildly thickened measuring 1.9 cm and AP dimension, image 58/7. No adnexal mass noted. Other: Trace ascites within the abdomen and pelvis. Musculoskeletal: Chronic bilateral L5 pars defects identified. No acute or suspicious osseous findings identified. IMPRESSION: 1. Ground-glass and airspace density within the left upper lobe may represent either acute pulmonary contusion or focal area of pneumonia. 2. Subacute to chronic left seventh rib fracture. 3. Morphologic features of the liver compatible with advanced cirrhosis and portal venous hypertension. These changes diminish sensitivity for detecting subtle liver injury. Within this limitation there is no convincing evidence for a liver laceration or contusion. 4. 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 5. Diffuse edema involving the colonic wall is identified which in the setting of advanced cirrhosis with portal venous hypertension is nonspecific.  This may reflect portal hypertensive colopathy versus inflammatory/infectious colitis. 6. Mild thickening of the endometrium. Consider further evaluation with pelvic sonogram. 7. Gallstones. 8. Soft tissue stranding within the left lower cervical and left supraclavicular  region. Findings are likely the sequelae of soft tissue injury in the setting of acute fall. No underlying clavicle fracture identified. 9. Aortic Atherosclerosis (ICD10-I70.0). Electronically Signed   By: Kerby Moors M.D.   On: 10/11/2021 13:32   DG CHEST PORT 1 VIEW  Result Date: 10/05/2021 CLINICAL DATA:  Central line placement. EXAM: PORTABLE CHEST 1 VIEW COMPARISON:  September 27, 2021 at 12:34 p.m. FINDINGS: A new left central line terminates in the SVC. No pneumothorax. The ETT is in good position. The NG tube terminates below today's film. The cardiomediastinal silhouette is stable. Probable small left effusion. Increasing opacity in the bases, left greater than right. IMPRESSION: 1. Support apparatus as above. No pneumothorax after central line placement. 2. Increasing haziness in the bases. Probable small left pleural effusion. 3. No other acute interval changes. Electronically Signed   By: Dorise Bullion III M.D.   On: 09/30/2021 18:45   DG Chest Port 1 View  Result Date: 09/25/2021 CLINICAL DATA:  Trauma, fall, seizure, head injury EXAM: PORTABLE CHEST 1 VIEW COMPARISON:  08/21/2020 FINDINGS: Endotracheal intubation, tip just above the carina, approximately 1 cm. Mild cardiomegaly. No acute airspace opacity. Minimally displaced fracture of the left seventh rib. IMPRESSION: 1. Endotracheal intubation, tip just above the carina, approximately 1 cm. Consider slight retraction to mid tracheal position. 2. No acute airspace opacity. 3. Minimally displaced fracture of the left seventh rib. No associated pneumothorax. Electronically Signed   By: Delanna Ahmadi M.D.   On: 10/06/2021 12:54    Microbiology Recent Results (from the past  240 hour(s))  Resp Panel by RT-PCR (Flu A&B, Covid) Nasopharyngeal Swab     Status: None   Collection Time: 10/04/2021 12:26 PM   Specimen: Nasopharyngeal Swab; Nasopharyngeal(NP) swabs in vial transport medium  Result Value Ref Range Status   SARS Coronavirus 2 by RT PCR NEGATIVE NEGATIVE Final    Comment: (NOTE) SARS-CoV-2 target nucleic acids are NOT DETECTED.  The SARS-CoV-2 RNA is generally detectable in upper respiratory specimens during the acute phase of infection. The lowest concentration of SARS-CoV-2 viral copies this assay can detect is 138 copies/mL. A negative result does not preclude SARS-Cov-2 infection and should not be used as the sole basis for treatment or other patient management decisions. A negative result may occur with  improper specimen collection/handling, submission of specimen other than nasopharyngeal swab, presence of viral mutation(s) within the areas targeted by this assay, and inadequate number of viral copies(<138 copies/mL). A negative result must be combined with clinical observations, patient history, and epidemiological information. The expected result is Negative.  Fact Sheet for Patients:  EntrepreneurPulse.com.au  Fact Sheet for Healthcare Providers:  IncredibleEmployment.be  This test is no t yet approved or cleared by the [place] FDA and  has been authorized for detection and/or diagnosis of SARS-CoV-2 by FDA under an Emergency Use Authorization (EUA). This EUA will remain  in effect (meaning this test can be used) for the duration of the COVID-19 declaration under Section 564(b)(1) of the Act, 21 U.S.C.section 360bbb-3(b)(1), unless the authorization is terminated  or revoked sooner.       Influenza A by PCR NEGATIVE NEGATIVE Final   Influenza B by PCR NEGATIVE NEGATIVE Final    Comment: (NOTE) The Xpert Xpress SARS-CoV-2/FLU/RSV plus assay is intended as an aid in the diagnosis of influenza  from Nasopharyngeal swab specimens and should not be used as a sole basis for treatment. Nasal washings and aspirates are unacceptable for Xpert Xpress SARS-CoV-2/FLU/RSV testing.  Fact Sheet for Patients: EntrepreneurPulse.com.au  Fact Sheet for Healthcare Providers: IncredibleEmployment.be  This test is not yet approved or cleared by the [place] FDA and has been authorized for detection and/or diagnosis of SARS-CoV-2 by FDA under an Emergency Use Authorization (EUA). This EUA will remain in effect (meaning this test can be used) for the duration of the COVID-19 declaration under Section 564(b)(1) of the Act, 21 U.S.C. section 360bbb-3(b)(1), unless the authorization is terminated or revoked.  Performed at [facility], [place] [address] [place], [place] [address]     Lab Basic Metabolic Panel: Recent Labs  Lab 10/06/2021 1245 10/16/2021 1257 10/15/2021 1653 10/01/2021 1731 10/09/2021 1814 09/21/2021 1842 10/12/2021 2359 15-Oct-2021 0007  NA 135 135 140 141 139  --  142 140  K 3.5 3.5 3.3* 3.0* 2.9*  --  3.5 3.4*  CL 102 104  --   --  107  --  103  --   CO2 7*  --   --   --  10*  --  <7*  --   GLUCOSE 110* 105*  --   --  45*  --  58*  --   BUN 9 9  --   --  9  --  12  --   CREATININE 1.22* 0.90  --   --  1.41*  --  1.71*  --   CALCIUM 7.8*  --   --   --  7.2*  --  8.9  --   MG  --   --   --   --   --  1.5*  --   --   PHOS  --   --   --   --   --  4.6  --   --    Liver Function Tests: Recent Labs  Lab 10/08/2021 1245 10/07/2021 2359  AST 130* 141*  ALT 35 30  ALKPHOS 119 94  BILITOT 9.2* 7.6*  PROT 5.8* 4.6*  ALBUMIN 2.3* 2.5*   Recent Labs  Lab 09/29/2021 1245  LIPASE 31   No results for input(s): AMMONIA in the last 168 hours. CBC: Recent Labs  Lab 10/04/2021 1245 09/25/2021 1257 09/25/2021 1653 10/18/2021 1731 09/22/2021 1842 10/21/2021 2359 October 15, 2021 0007  WBC 10.0  --   --   --  19.6* 26.5*  --   HGB 10.9*   < > 6.1* 5.4* 6.2*  8.0* 7.5*  HCT 35.9*   < > 18.0* 16.0* 19.9* 26.3* 22.0*  MCV 105.0*  --   --   --  103.6* 101.9*  --   PLT PLATELET CLUMPS NOTED ON SMEAR, UNABLE TO ESTIMATE  --   --   --  52* 83*  --    < > = values in this interval not displayed.   Cardiac Enzymes: Recent Labs  Lab 10/03/2021 1842 10/15/2021 2359  CKTOTAL 906* 2,332*   Sepsis Labs: Recent Labs  Lab 10/18/2021 1245 10/20/2021 1842 09/24/2021 2359  WBC 10.0 19.6* 26.5*  LATICACIDVEN >9.0*  --   --     Procedures/Operations  R crani CVC x2 Art line placement   Jennika Ringgold N Ewin Rehberg 10-15-2021, 5:56 AM

## 2021-10-22 NOTE — Progress Notes (Signed)
0000 labs resulted. Patient seen and examined. MAP62 on levo 40, vaso 0.04, epi 20. Oliguric. Bicarb x4 ordered, 1 amp of D50 given, 1L albumin ordered. Discussed this case with CCM on call, recs for 500mg  thiamine. We discussed the possibility of CVVHD for acidemia, however neither of [place] believe that this would make any meaningful positive contribution to her outcome as fulminant hepatic dysfunction is at the core of her critical illness. Called patient's father to provide a clinical update and unable to reach mother on home or cell phone numbers. Counseled that while we will continue interventions, she is at risk of imminent demise and I would not be surprised if she went into cardiac arrest before sunrise. We discussed resuscitative options and the essentially zero chance of chest compressions and defibrillation having any positive clinical impact. Father declines these interventions. Code status changed electronically.   Additional critical care time: 8min  Jesusita Oka, MD General and [facility]

## 2021-10-22 DEATH — deceased

## 2023-06-16 IMAGING — DX DG PORTABLE PELVIS
1 series · 1 of 1 positions shown · non-contrast
Comparison: None.

CLINICAL DATA: Trauma, fall, seizure, head injury

EXAM:
PORTABLE PELVIS 1-2 VIEWS

[pelvis ap]
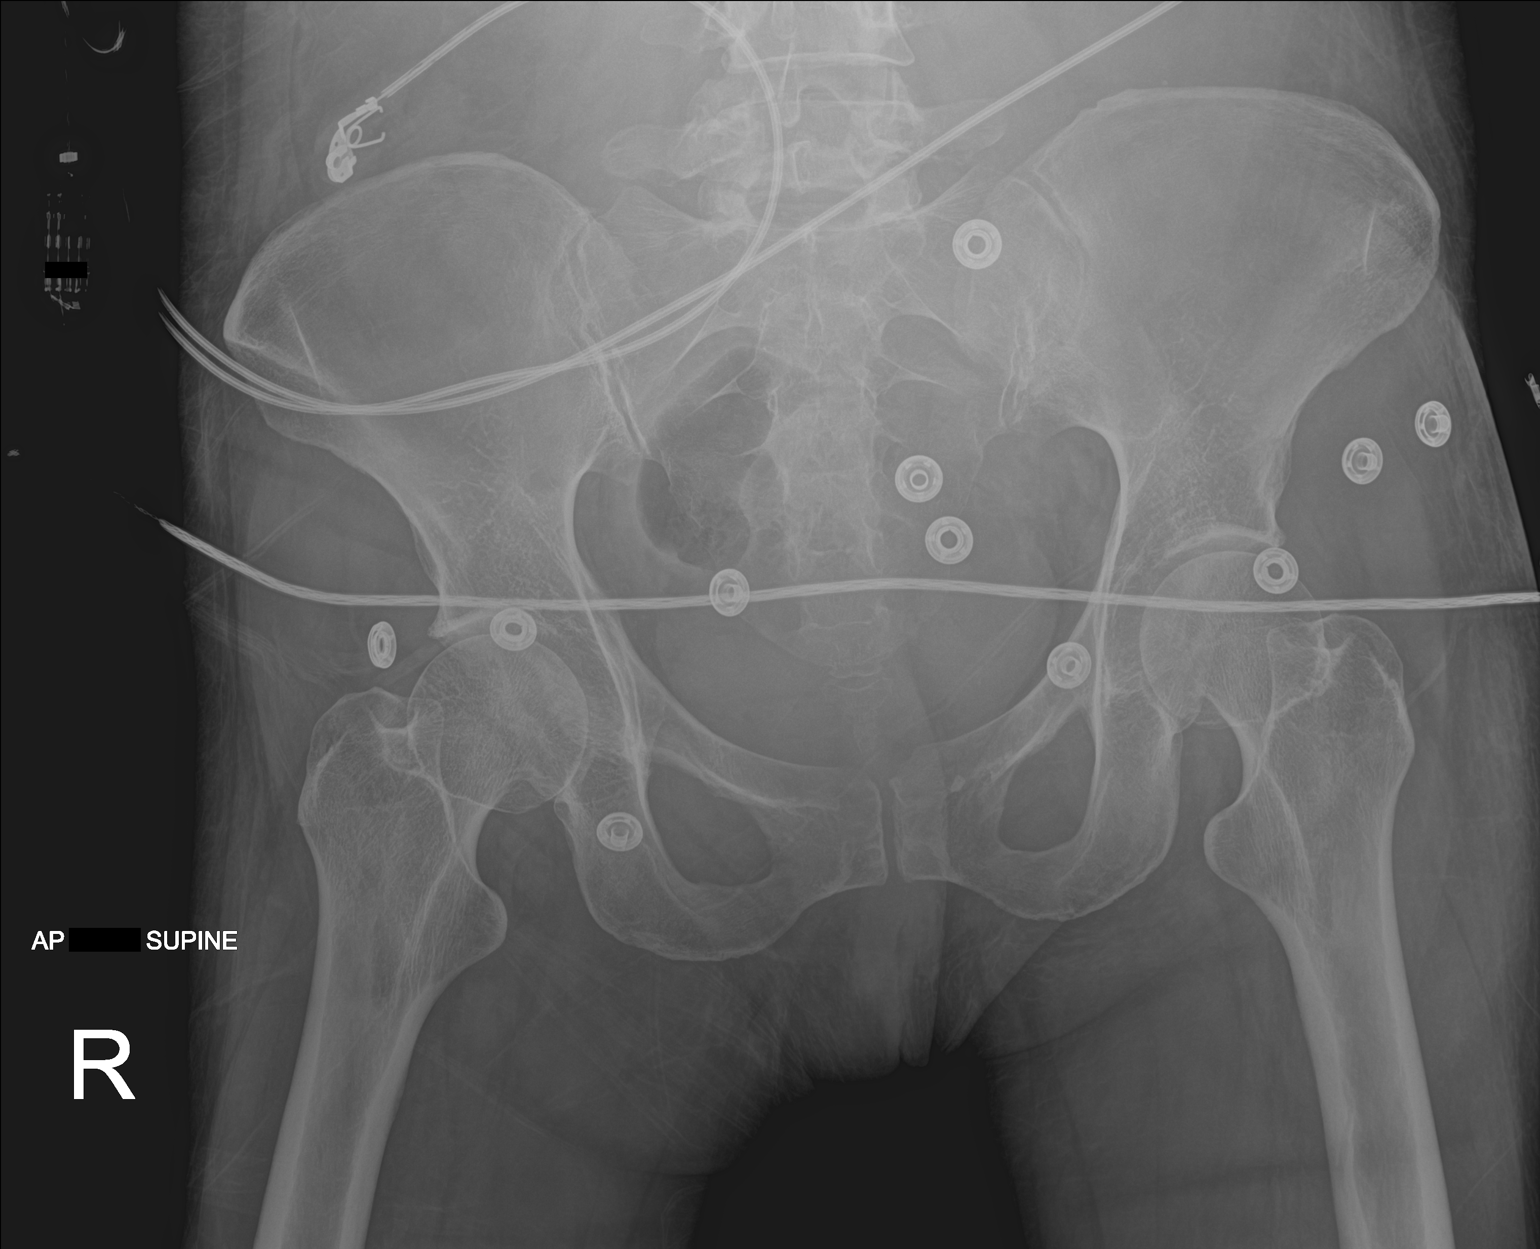

[1 of 1 positions shown; findings below may reference images not displayed]

FINDINGS: There is no evidence of displaced pelvic fracture or diastasis. No
pelvic bone lesions are seen.
IMPRESSION: No displaced fracture or dislocation of the pelvis.

## 2023-06-16 IMAGING — CT CT HEAD W/O CM
4 series · 16 of 47 positions shown, 18 images · non-contrast
Comparison: CT head dated April 25, 2021

CLINICAL DATA: Head trauma, moderate to severe, mental status
change.

EXAM:
CT HEAD WITHOUT CONTRAST
TECHNIQUE: Contiguous axial images were obtained from the base of the skull
through the vertex without intravenous contrast.

[Series 3: head wo · axial · 0.45mm/px · z∈[-152,-27]mm · 7 of 35 slices shown, 9 images]
[im 5/35  brain]
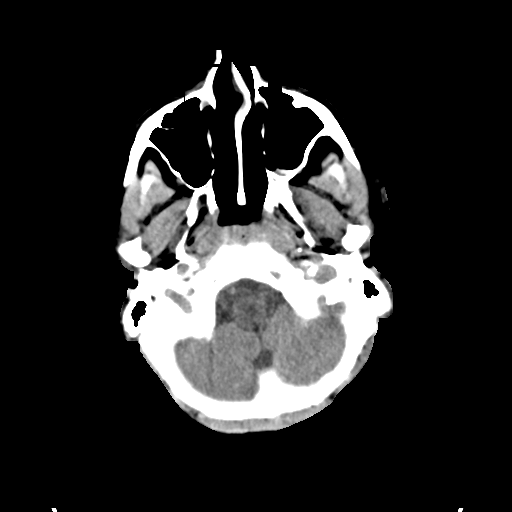
[im 5/35  bone]
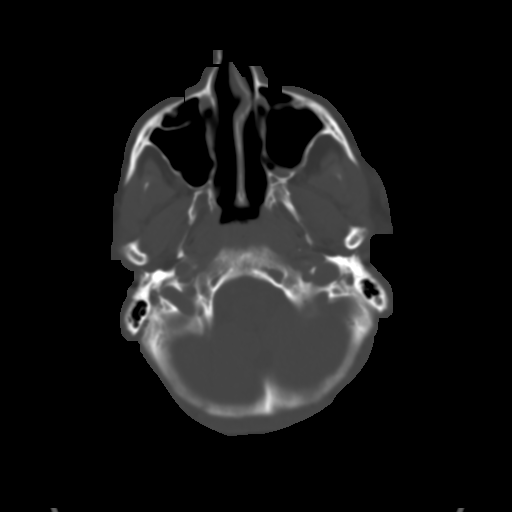
[im 9/35  brain]
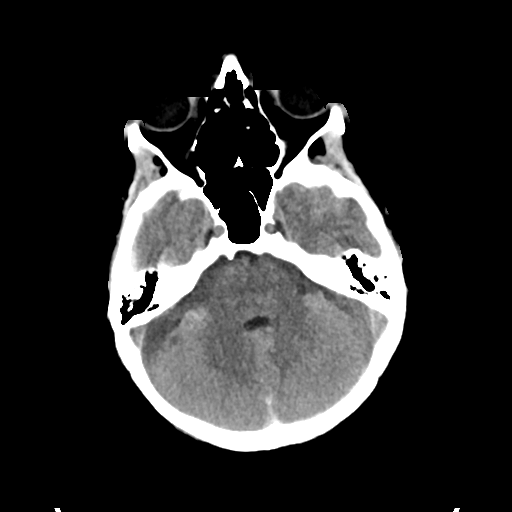
[im 13/35  brain]
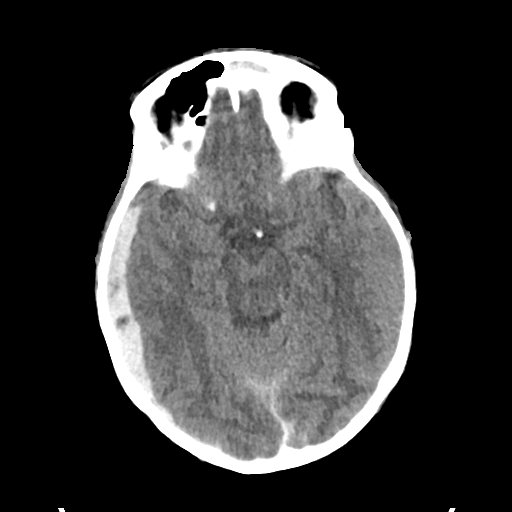
[im 18/35  brain]
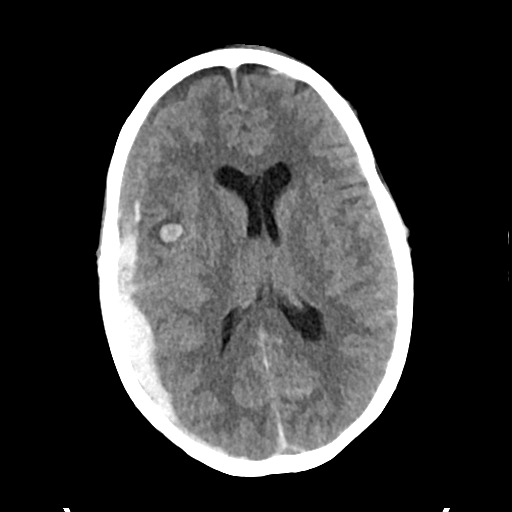
[im 22/35  brain]
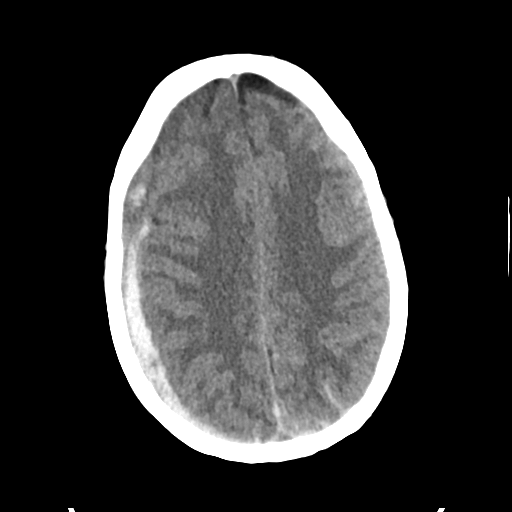
[im 22/35  bone]
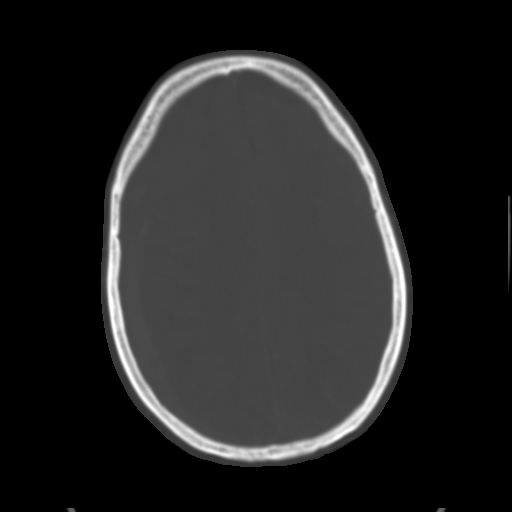
[im 26/35  brain]
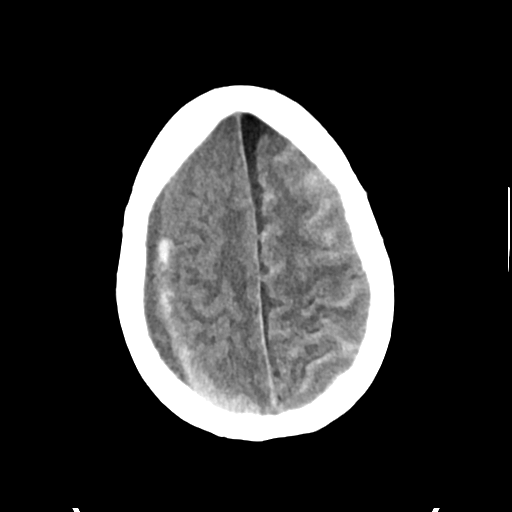
[im 30/35  brain]
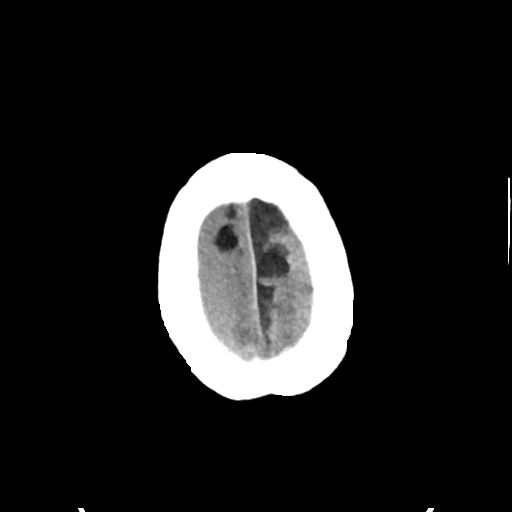

[Series 4: head bone · axial · 0.45mm/px · z∈[-156,-122]mm · 3 of 86 slices shown]
[im 9/86  bone]
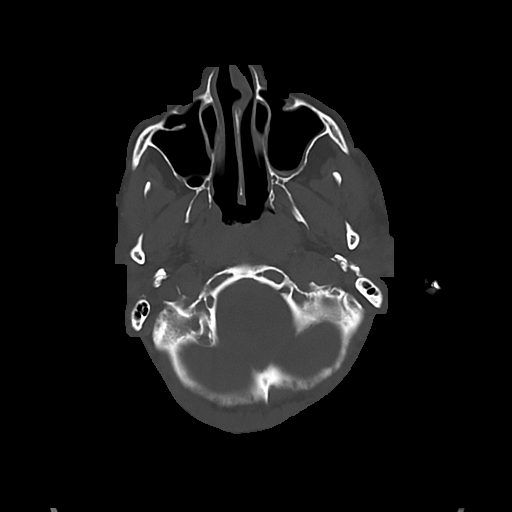
[im 18/86  bone]
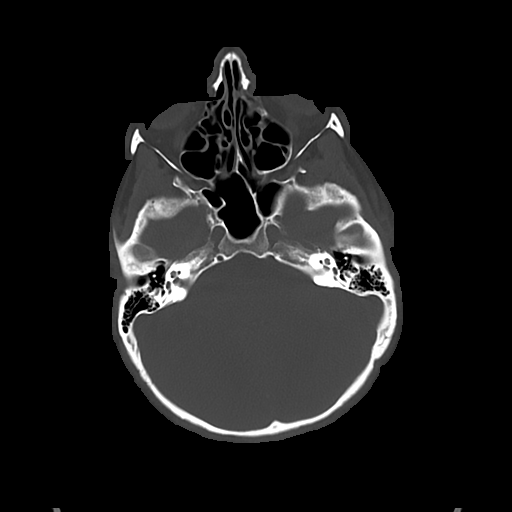
[im 26/86  bone]
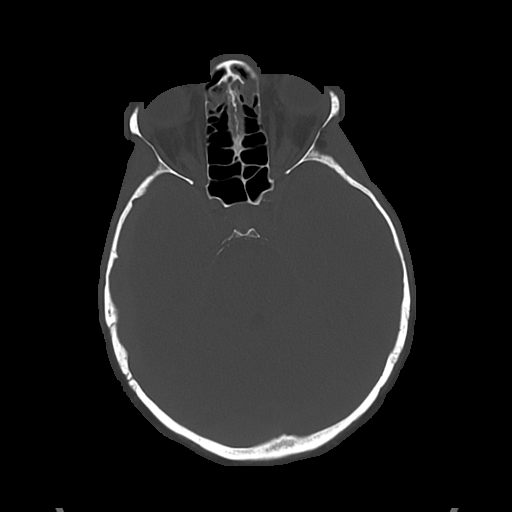

[Series 5: cor soft · coronal · 0.31mm/px · 3 of 72 slices shown]
[im 24/72  brain]
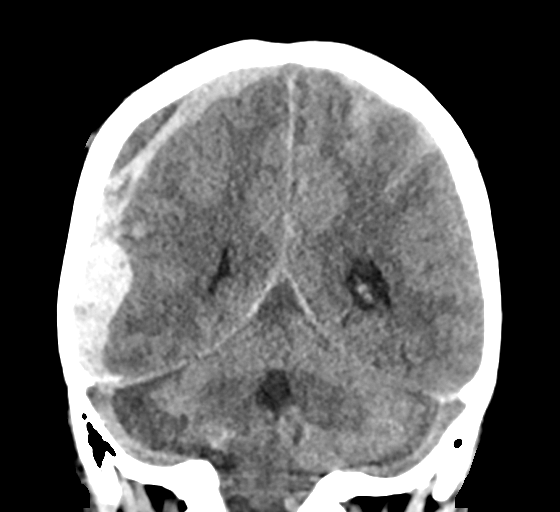
[im 32/72  brain]
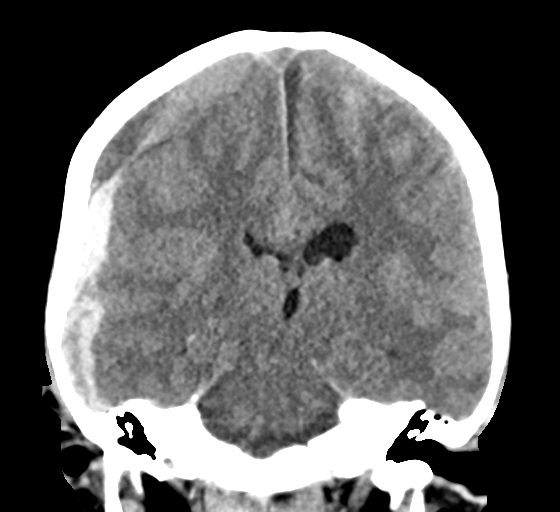
[im 40/72  brain]
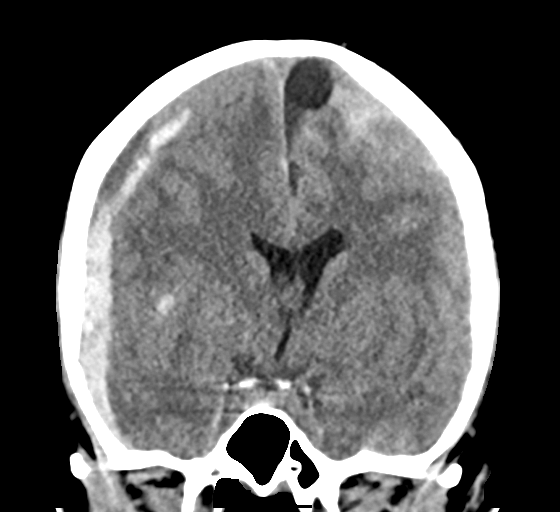

[Series 6: sag soft · sagittal · 0.39mm/px · 3 of 57 slices shown]
[im 19/57  brain]
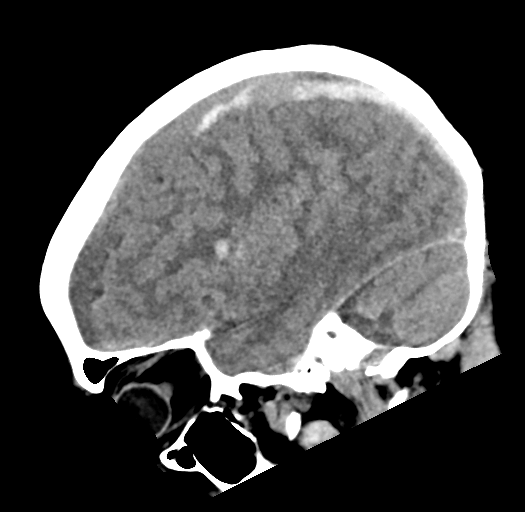
[im 29/57  brain]
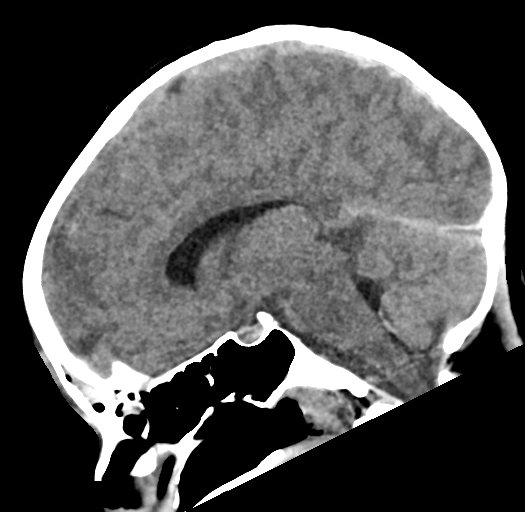
[im 38/57  brain]
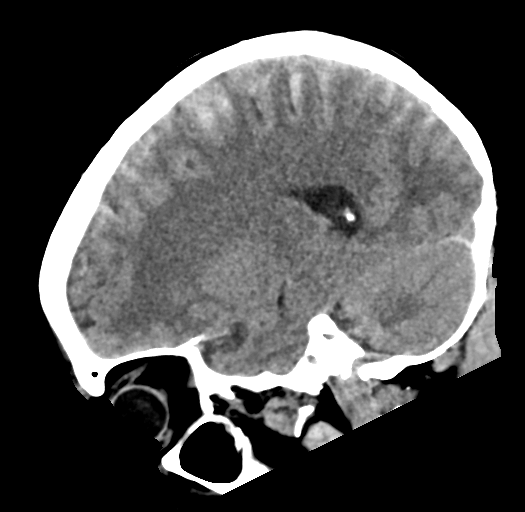

[16 of 47 positions shown; findings below may reference images not displayed]

FINDINGS: Brain: There is heterogeneous collection in the right extra-axial
space likely representing acute subdural hemorrhage in the
background of prior subacute subdural hemorrhage. This hemorrhage at
the level of the right temporal lobe measures up to 1.5 cm. There is
a small intracerebral hemorrhage in the right frontal lobe measuring
0 0.7 x 0.9 x 0.9 cm. There is mass effect with approximately 5 mm
right-to-left midline shift. There is also multiple foci of
subarachnoid hemorrhage in the left high frontal lobe. Trace amount
of hemorrhage along the posterior falx as well as along the
tentorium.

Vascular: No hyperdense vessel or unexpected calcification.

Skull: Right midline frontal scalp hematoma without evidence of
calvarial fracture.

Sinuses/Orbits: Patchy opacification of the ethmoid air cells.
Mucosal thickening of the left maxillary sinus. The remaining
paranasal sinuses and mastoid air cells are clear.

Other: None.
IMPRESSION: 1. Acute right cerebral convexity hemorrhage, likely in the
background of prior subacute subdural hemorrhage with edema and
approximately 5 mm right-to-left midline shift. There is also small
right intra parenchymal hemorrhage in the right frontal lobe
measuring 0.7 x 0.9 x 0.9 cm.

Multiple foci of subarachnoid hemorrhage in the high left frontal
lobe.

Trace amount of hemorrhage along the posterior falx cerebri as well
as along the tentorium.

2. Midline frontal scalp hematoma without evidence of calvarial
fracture.

## 2023-06-16 IMAGING — CT CT CHEST-ABD-PELV W/ CM
2 of 5 series · 11 of 46 positions shown, 12 images · IV contrast (omnipaque)
Comparison: CT AP 08/21/2020

CLINICAL DATA: Abdominal trauma.  Reportedly fell from bed.

EXAM:
CT CHEST, ABDOMEN, AND PELVIS WITH CONTRAST
TECHNIQUE: Multidetector CT imaging of the chest, abdomen and pelvis was
performed following the standard protocol during bolus
administration of intravenous contrast.
CONTRAST:  100mL OMNIPAQUE IOHEXOL 350 MG/ML SOLN

[Series 3: cap with · axial · 0.86mm/px · z∈[-911,-306]mm · 8 of 145 slices shown, 9 images]
[im 12/145  soft-tissue]
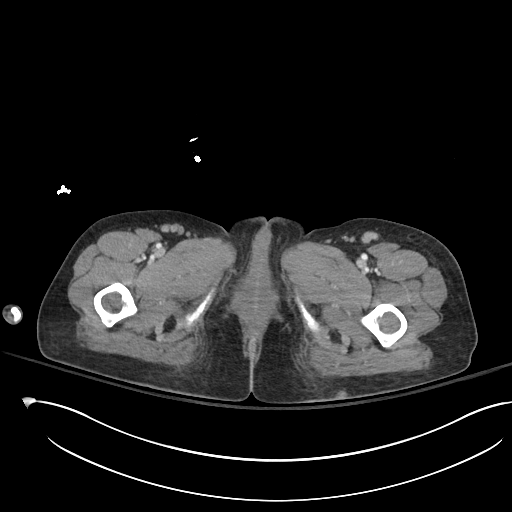
[im 12/145  bone]
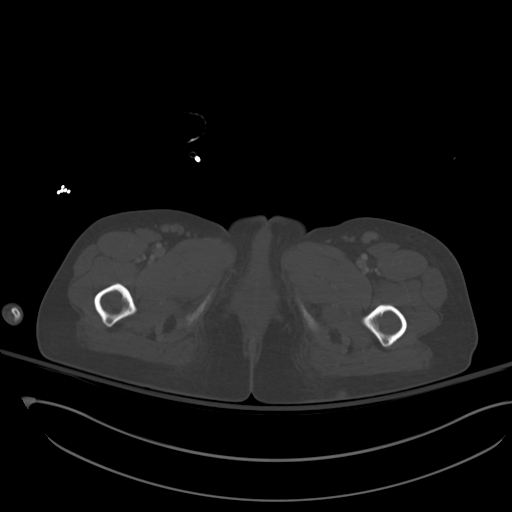
[im 34/145  soft-tissue]
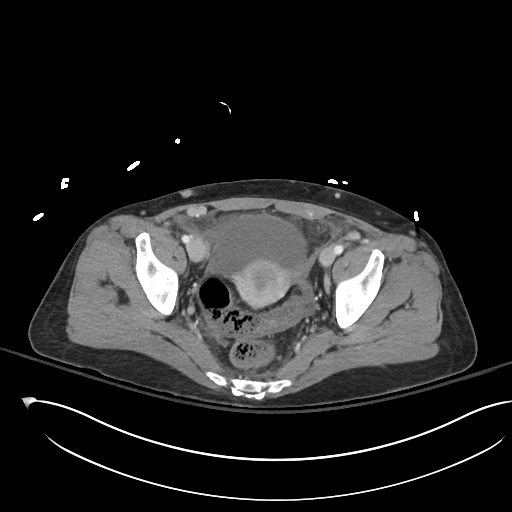
[im 45/145  soft-tissue]
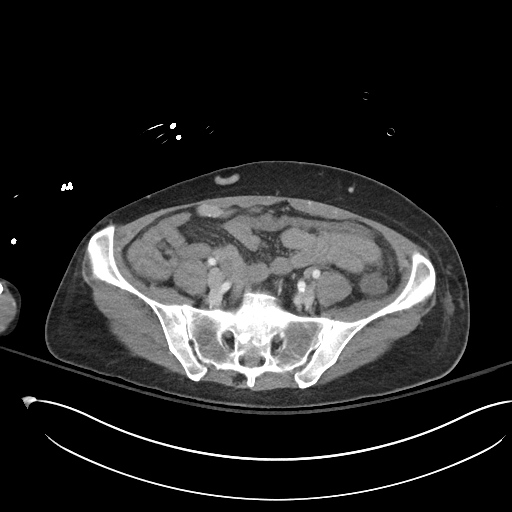
[im 67/145  soft-tissue]
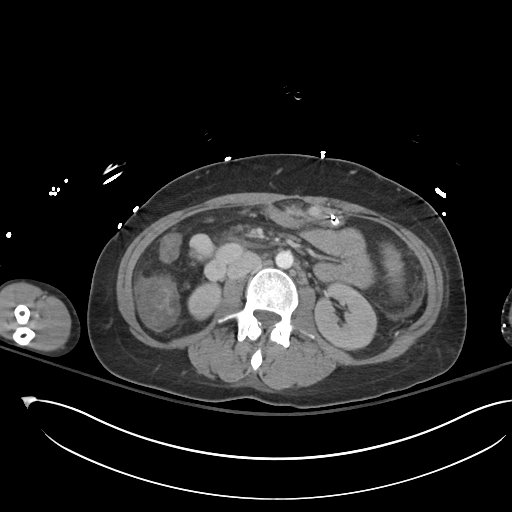
[im 78/145  soft-tissue]
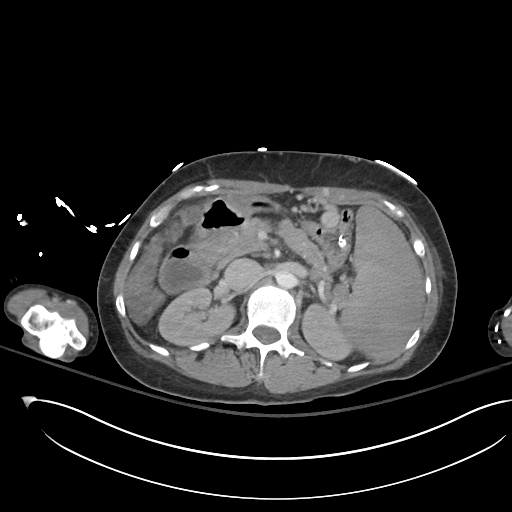
[im 100/145  soft-tissue]
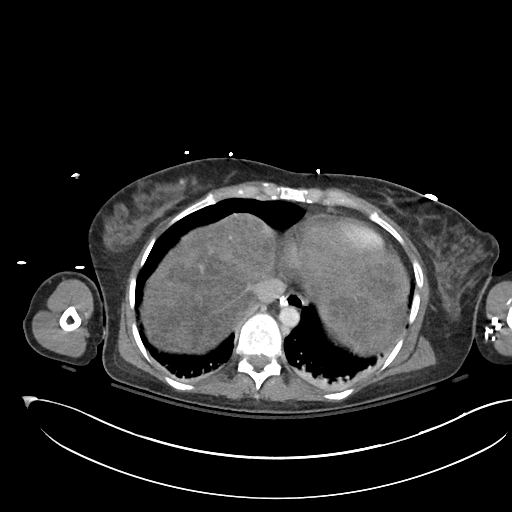
[im 111/145  soft-tissue]
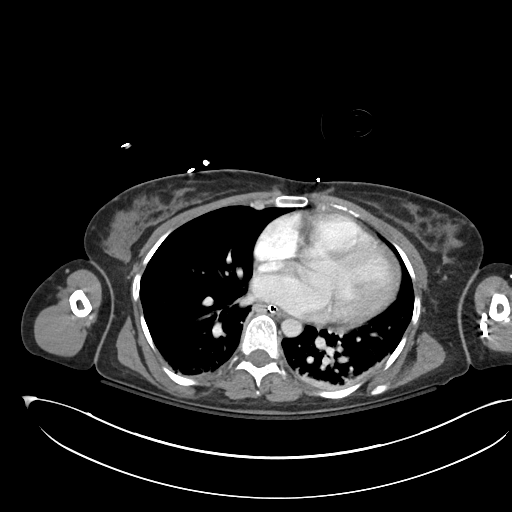
[im 133/145  soft-tissue]
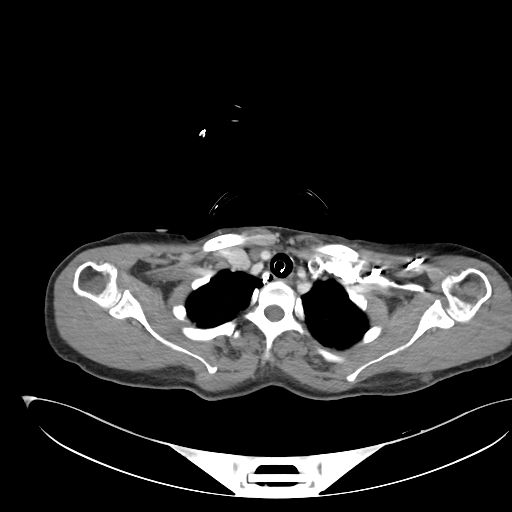

[Series 6: cor · coronal · 0.66mm/px · 3 of 77 slices shown]
[im 26/77  soft-tissue]
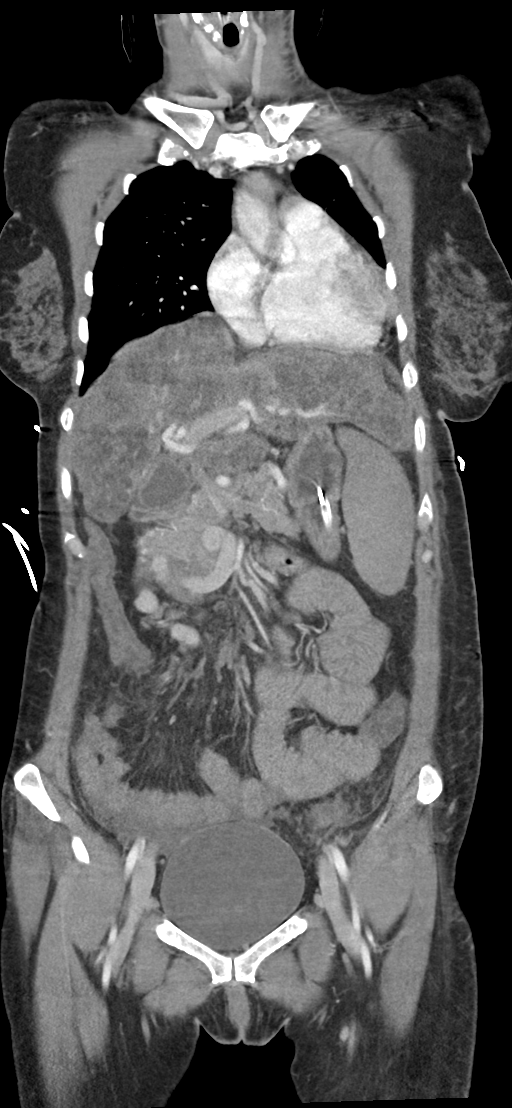
[im 34/77  soft-tissue]
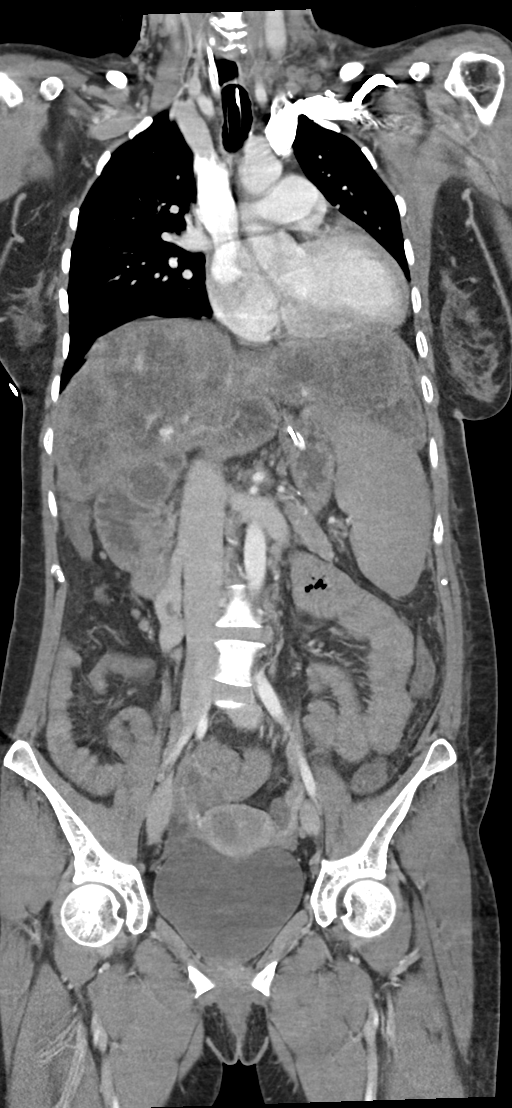
[im 43/77  soft-tissue]
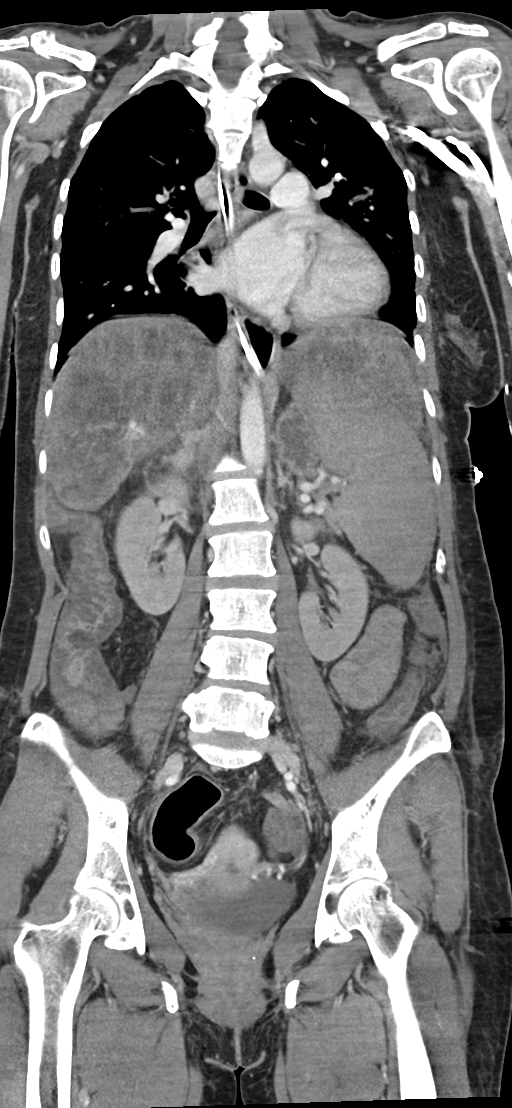

[11 of 46 positions shown; findings below may reference images not displayed]

FINDINGS: CT CHEST FINDINGS

Cardiovascular: Normal heart size.  No pericardial effusion.

Mediastinum/Nodes: Soft tissue stranding is identified within the
left lower cervical and left supraclavicular region. ET tube tip is
above the carina. There is a NG tube with tip in the stomach. No
mediastinal or hilar adenopathy.

Lungs/Pleura: No pleural effusion or pneumothorax. Ground-glass and
airspace density within the left upper lobe measures 2.6 cm, image
62/5 and may represent either acute pulmonary contusion or focal
area of pneumonia. Several small lung nodules are noted within the
periphery of the left upper lobe which measure up to 3 mm, image
54/5. Dependent changes are noted overlying the posterior lungs.

Musculoskeletal: Subacute to chronic left seventh rib fracture,
image 119/5. No acute osseous findings identified.

CT ABDOMEN PELVIS FINDINGS

Hepatobiliary: Severe changes of nodular cirrhosis with marked
diffuse fatty infiltration. These changes diminish sensitivity for
detecting subtle liver injury. Within this limitation there is no
convincing evidence for a liver laceration or contusion. Gallstones
identified within the gallbladder. No signs of bile duct dilatation.

Pancreas: Unremarkable. No pancreatic ductal dilatation or
surrounding inflammatory changes.

Spleen: The spleen is enlarged measuring 13.3 cm in cranial caudal
dimension. No signs of splenic injury or perisplenic hematoma.

Adrenals/Urinary Tract: Adrenal glands are normal. No signs of
adrenal hemorrhage or renal injury. Bladder unremarkable.

Stomach/Bowel: Enteric tube tip is in the body of stomach. Stomach
appears decompressed. Small bowel loops are unremarkable. Diffuse
edema involving the colonic wall is identified which in the setting
of advanced cirrhosis with portal venous hypertension is
nonspecific. This may reflect portal hypertensive colopathy versus
inflammatory/infectious colitis.

Vascular/Lymphatic: There is a normal caliber abdominal aorta. The
right portal vein is not visualized, similar to previous exam and is
likely chronically occluded. Signs of partial cavernous
transformation noted. Recanalization of the umbilical vein which
appears markedly enlarged with extensive collaterals extending along
the undersurface of the ventral abdominal wall. No abdominopelvic
adenopathy.

Reproductive: The endometrium appears mildly thickened measuring
cm and AP dimension, image 58/7. No adnexal mass noted.

Other: Trace ascites within the abdomen and pelvis.

Musculoskeletal: Chronic bilateral L5 pars defects identified. No
acute or suspicious osseous findings identified.
IMPRESSION: 1. Ground-glass and airspace density within the left upper lobe may
represent either acute pulmonary contusion or focal area of
pneumonia.
2. Subacute to chronic left seventh rib fracture.
3. Morphologic features of the liver compatible with advanced
cirrhosis and portal venous hypertension. These changes diminish
sensitivity for detecting subtle liver injury. Within this
limitation there is no convincing evidence for a liver laceration or
contusion.
4. Chronic occlusion of the right portal vein with partial cavernous
transformation.
5. Diffuse edema involving the colonic wall is identified which in
the setting of advanced cirrhosis with portal venous hypertension is
nonspecific. This may reflect portal hypertensive colopathy versus
inflammatory/infectious colitis.
6. Mild thickening of the endometrium. Consider further evaluation
with pelvic sonogram.
7. Gallstones.
8. Soft tissue stranding within the left lower cervical and left
supraclavicular region. Findings are likely the sequelae of soft
tissue injury in the setting of acute fall. No underlying clavicle
fracture identified.
9. Aortic Atherosclerosis ([id number]).
